# Patient Record
Sex: Female | Born: 1975 | Race: White | Hispanic: No | Marital: Married | Smoking: Never smoker
Health system: Southern US, Community
[De-identification: ages and names within clinical notes are randomized; demographics above are authoritative.]

## PROBLEM LIST (undated history)

## (undated) DIAGNOSIS — I10 Essential (primary) hypertension: Secondary | ICD-10-CM

## (undated) DIAGNOSIS — M797 Fibromyalgia: Secondary | ICD-10-CM

## (undated) DIAGNOSIS — J45909 Unspecified asthma, uncomplicated: Secondary | ICD-10-CM

## (undated) DIAGNOSIS — K760 Fatty (change of) liver, not elsewhere classified: Secondary | ICD-10-CM

## (undated) DIAGNOSIS — K838 Other specified diseases of biliary tract: Secondary | ICD-10-CM

## (undated) DIAGNOSIS — K8020 Calculus of gallbladder without cholecystitis without obstruction: Secondary | ICD-10-CM

## (undated) DIAGNOSIS — Z86718 Personal history of other venous thrombosis and embolism: Secondary | ICD-10-CM

## (undated) HISTORY — DX: Calculus of gallbladder without cholecystitis without obstruction: K80.20

## (undated) HISTORY — DX: Fatty (change of) liver, not elsewhere classified: K76.0

## (undated) HISTORY — DX: Other specified diseases of biliary tract: K83.8

## (undated) HISTORY — DX: Fibromyalgia: M79.7

## (undated) HISTORY — DX: Personal history of other venous thrombosis and embolism: Z86.718

## (undated) HISTORY — PX: COCCYGECTOMY: SUR274

## (undated) HISTORY — DX: Unspecified asthma, uncomplicated: J45.909

## (undated) HISTORY — DX: Essential (primary) hypertension: I10

---

## 2003-08-22 HISTORY — PX: SPINE SURGERY: SHX786

## 2003-09-14 ENCOUNTER — Ambulatory Visit (HOSPITAL_BASED_OUTPATIENT_CLINIC_OR_DEPARTMENT_OTHER): Admission: RE | Admit: 2003-09-14 | Discharge: 2003-09-14 | Payer: Self-pay | Admitting: Orthopedic Surgery

## 2004-01-10 ENCOUNTER — Ambulatory Visit (HOSPITAL_COMMUNITY): Admission: RE | Admit: 2004-01-10 | Discharge: 2004-01-10 | Payer: Self-pay | Admitting: Orthopedic Surgery

## 2006-10-31 ENCOUNTER — Ambulatory Visit (HOSPITAL_COMMUNITY): Admission: RE | Admit: 2006-10-31 | Discharge: 2006-10-31 | Payer: Self-pay | Admitting: Orthopedic Surgery

## 2008-08-21 HISTORY — PX: ABLATION: SHX5711

## 2010-05-05 ENCOUNTER — Ambulatory Visit: Payer: Self-pay | Admitting: Diagnostic Radiology

## 2010-05-05 ENCOUNTER — Emergency Department (HOSPITAL_BASED_OUTPATIENT_CLINIC_OR_DEPARTMENT_OTHER): Admission: EM | Admit: 2010-05-05 | Discharge: 2010-05-05 | Payer: Self-pay | Admitting: Emergency Medicine

## 2010-11-03 LAB — DIFFERENTIAL
Basophils Absolute: 0.1 10*3/uL (ref 0.0–0.1)
Basophils Relative: 1 % (ref 0–1)
Eosinophils Absolute: 0.3 10*3/uL (ref 0.0–0.7)
Lymphs Abs: 2.2 10*3/uL (ref 0.7–4.0)
Monocytes Absolute: 0.7 10*3/uL (ref 0.1–1.0)
Monocytes Relative: 10 % (ref 3–12)
Neutrophils Relative %: 54 % (ref 43–77)

## 2010-11-03 LAB — URINE CULTURE
Colony Count: 40000
Culture  Setup Time: 201109151757

## 2010-11-03 LAB — URINE MICROSCOPIC-ADD ON

## 2010-11-03 LAB — COMPREHENSIVE METABOLIC PANEL
BUN: 13 mg/dL (ref 6–23)
Chloride: 108 mEq/L (ref 96–112)
GFR calc Af Amer: >60 mL/min (ref >60)
GFR calc non Af Amer: >60 mL/min (ref >60)
Glucose, Bld: 97 mg/dL (ref 70–99)
Potassium: 4.3 mEq/L (ref 3.5–5.1)
Sodium: 143 mEq/L (ref 135–145)

## 2010-11-03 LAB — CBC
Hemoglobin: 15.1 g/dL — ABNORMAL HIGH (ref 12.0–15.0)
MCH: 31.5 pg (ref 26.0–34.0)
MCHC: 34.9 g/dL (ref 30.0–36.0)
Platelets: 240 10*3/uL (ref 150–400)

## 2010-11-03 LAB — PREGNANCY, URINE: Preg Test, Ur: NEGATIVE

## 2010-11-03 LAB — URINALYSIS, ROUTINE W REFLEX MICROSCOPIC
Hgb urine dipstick: NEGATIVE
Urobilinogen, UA: 0.2 mg/dL (ref 0.0–1.0)

## 2011-01-06 NOTE — Op Note (Signed)
[name], [name]                         ACCOUNT NO.:  [id number]   MEDICAL RECORD NO.:  [id number]                   PATIENT TYPE:  AMB   LOCATION:  DSC                                  FACILITY:  [facility]   PHYSICIAN:  Harvie Junior, M.D.                DATE OF BIRTH:  Feb 05, 1976   DATE OF PROCEDURE:  09/14/2003  DATE OF DISCHARGE:                                 OPERATIVE REPORT   PREOPERATIVE DIAGNOSIS:  Painful coccyx.   POSTOPERATIVE DIAGNOSIS:  Painful coccyx.   PROCEDURE:  Coccygectomy.   SURGEON:  Harvie Junior, M.D.   ASSISTANT:  Marshia Ly, P.A.   ANESTHESIA:  General.   INDICATIONS:  Denise Vazquez is a 35 year old female with a long history of  having coccyx pain every since childbirth.  She has been having pain in that  area ever since that time.  We tried cortisone injection.  We tried anti-  inflammatory medication, activity modification, donut sitting and all of  this failed over the course of a year.  Because of continuing problems with  pain she is ultimately brought to the operating room for coccygectomy.   DESCRIPTION OF PROCEDURE:  The patient was brought to the operating room and  after adequate anesthesia was obtained with a general anesthetic, the  patient was placed prone on the operating table and slight flexion was put  in place.  At this point, the cheeks were retracted with tape and injection  of 0.25% Marcaine with 1:100,000 was instilled into the area of incision for  hemostasis.  At this point, attention was turned towards prepping and  draping this area.  A Betadine sponge was placed into the area of the anus  and then this area was walled off from the incision. At this point,  attention was turned towards prepping and draping and a routine prep and  drape was performed.  At this point, a midline incision was made.  Subcutaneous tissue was dissected down to the level of the coccyx which was  clearly identified.  The coccyx was then  removed from proximal to distal,  care being taken to stay on bone, work all the way around the bone with  Kittners, Bovie, traction, counter traction techniques.  Once the coccyx had  been removed, the wound was filled with normal saline irrigation.  The  Valsalva maneuver was performed.  There appeared to be a very nicely  maintained intact layer between the coccyx and the rectum at this point.  At  this point, the wound was copiously irrigated and suctioned dry.  Meticulous  hemostasis was obtained with electrocautery.  A sterile compressive dressing  was applied at this point after layered closure with 0, 2-0 Vicryl and 4-0  nylon sutures.  The patient at this point was taken to the recovery room.  She was noted to be in satisfactory condition.  Estimated blood loss for the  procedure was 50 mL.                                               Harvie Junior, M.D.    Ranae Plumber  D:  09/14/2003  T:  09/14/2003  Job:  [id number]

## 2012-08-21 HISTORY — PX: OTHER SURGICAL HISTORY: SHX169

## 2015-08-19 DIAGNOSIS — G4700 Insomnia, unspecified: Secondary | ICD-10-CM | POA: Insufficient documentation

## 2015-08-19 DIAGNOSIS — K644 Residual hemorrhoidal skin tags: Secondary | ICD-10-CM | POA: Insufficient documentation

## 2015-08-19 DIAGNOSIS — E042 Nontoxic multinodular goiter: Secondary | ICD-10-CM | POA: Insufficient documentation

## 2015-08-19 DIAGNOSIS — F319 Bipolar disorder, unspecified: Secondary | ICD-10-CM | POA: Insufficient documentation

## 2015-08-19 DIAGNOSIS — K6289 Other specified diseases of anus and rectum: Secondary | ICD-10-CM | POA: Insufficient documentation

## 2015-08-19 DIAGNOSIS — M5412 Radiculopathy, cervical region: Secondary | ICD-10-CM | POA: Insufficient documentation

## 2015-08-19 DIAGNOSIS — J45909 Unspecified asthma, uncomplicated: Secondary | ICD-10-CM | POA: Insufficient documentation

## 2015-08-19 DIAGNOSIS — G43009 Migraine without aura, not intractable, without status migrainosus: Secondary | ICD-10-CM | POA: Insufficient documentation

## 2015-08-19 DIAGNOSIS — E661 Drug-induced obesity: Secondary | ICD-10-CM | POA: Insufficient documentation

## 2015-08-19 DIAGNOSIS — G2581 Restless legs syndrome: Secondary | ICD-10-CM | POA: Insufficient documentation

## 2015-08-19 DIAGNOSIS — I471 Supraventricular tachycardia, unspecified: Secondary | ICD-10-CM | POA: Insufficient documentation

## 2015-08-20 ENCOUNTER — Ambulatory Visit (HOSPITAL_BASED_OUTPATIENT_CLINIC_OR_DEPARTMENT_OTHER)
Admission: RE | Admit: 2015-08-20 | Discharge: 2015-08-20 | Disposition: A | Discharge status: Home / Self Care | Payer: Self-pay | Source: Ambulatory Visit | Attending: Family Medicine | Admitting: Family Medicine

## 2015-08-20 ENCOUNTER — Other Ambulatory Visit: Payer: Self-pay | Admitting: Family Medicine

## 2015-08-20 ENCOUNTER — Ambulatory Visit (HOSPITAL_BASED_OUTPATIENT_CLINIC_OR_DEPARTMENT_OTHER): Payer: Self-pay

## 2015-08-20 DIAGNOSIS — R52 Pain, unspecified: Secondary | ICD-10-CM

## 2015-08-20 DIAGNOSIS — I8392 Asymptomatic varicose veins of left lower extremity: Secondary | ICD-10-CM | POA: Insufficient documentation

## 2015-08-20 DIAGNOSIS — M79652 Pain in left thigh: Secondary | ICD-10-CM | POA: Insufficient documentation

## 2015-08-30 DIAGNOSIS — I83892 Varicose veins of left lower extremities with other complications: Secondary | ICD-10-CM | POA: Insufficient documentation

## 2016-05-09 DIAGNOSIS — I872 Venous insufficiency (chronic) (peripheral): Secondary | ICD-10-CM | POA: Insufficient documentation

## 2016-05-11 DIAGNOSIS — R109 Unspecified abdominal pain: Secondary | ICD-10-CM | POA: Insufficient documentation

## 2017-07-28 DIAGNOSIS — S92251A Displaced fracture of navicular [scaphoid] of right foot, initial encounter for closed fracture: Secondary | ICD-10-CM | POA: Insufficient documentation

## 2017-07-28 DIAGNOSIS — S32001A Stable burst fracture of unspecified lumbar vertebra, initial encounter for closed fracture: Secondary | ICD-10-CM | POA: Insufficient documentation

## 2017-08-01 DIAGNOSIS — S22079A Unspecified fracture of T9-T10 vertebra, initial encounter for closed fracture: Secondary | ICD-10-CM | POA: Insufficient documentation

## 2018-01-17 DIAGNOSIS — G90521 Complex regional pain syndrome I of right lower limb: Secondary | ICD-10-CM | POA: Insufficient documentation

## 2018-07-28 ENCOUNTER — Inpatient Hospital Stay (HOSPITAL_COMMUNITY)
Admission: EM | Admit: 2018-07-28 | Discharge: 2018-08-02 | DRG: 917 | Disposition: A | Discharge status: Home / Self Care | Payer: Self-pay | Attending: Internal Medicine | Admitting: Internal Medicine

## 2018-07-28 ENCOUNTER — Emergency Department (HOSPITAL_COMMUNITY): Payer: Self-pay

## 2018-07-28 DIAGNOSIS — E876 Hypokalemia: Secondary | ICD-10-CM | POA: Diagnosis not present

## 2018-07-28 DIAGNOSIS — A408 Other streptococcal sepsis: Secondary | ICD-10-CM | POA: Diagnosis present

## 2018-07-28 DIAGNOSIS — J690 Pneumonitis due to inhalation of food and vomit: Secondary | ICD-10-CM | POA: Diagnosis present

## 2018-07-28 DIAGNOSIS — F1110 Opioid abuse, uncomplicated: Secondary | ICD-10-CM | POA: Diagnosis present

## 2018-07-28 DIAGNOSIS — Z833 Family history of diabetes mellitus: Secondary | ICD-10-CM

## 2018-07-28 DIAGNOSIS — M545 Low back pain: Secondary | ICD-10-CM | POA: Diagnosis present

## 2018-07-28 DIAGNOSIS — J45901 Unspecified asthma with (acute) exacerbation: Secondary | ICD-10-CM | POA: Diagnosis present

## 2018-07-28 DIAGNOSIS — G9340 Encephalopathy, unspecified: Secondary | ICD-10-CM | POA: Diagnosis present

## 2018-07-28 DIAGNOSIS — D649 Anemia, unspecified: Secondary | ICD-10-CM | POA: Diagnosis present

## 2018-07-28 DIAGNOSIS — G8929 Other chronic pain: Secondary | ICD-10-CM | POA: Diagnosis present

## 2018-07-28 DIAGNOSIS — I10 Essential (primary) hypertension: Secondary | ICD-10-CM

## 2018-07-28 DIAGNOSIS — R4182 Altered mental status, unspecified: Secondary | ICD-10-CM

## 2018-07-28 DIAGNOSIS — G9050 Complex regional pain syndrome I, unspecified: Secondary | ICD-10-CM | POA: Diagnosis present

## 2018-07-28 DIAGNOSIS — N179 Acute kidney failure, unspecified: Secondary | ICD-10-CM | POA: Diagnosis present

## 2018-07-28 DIAGNOSIS — R000 Tachycardia, unspecified: Secondary | ICD-10-CM | POA: Diagnosis present

## 2018-07-28 DIAGNOSIS — Z888 Allergy status to other drugs, medicaments and biological substances status: Secondary | ICD-10-CM

## 2018-07-28 DIAGNOSIS — K029 Dental caries, unspecified: Secondary | ICD-10-CM | POA: Diagnosis present

## 2018-07-28 DIAGNOSIS — T424X1A Poisoning by benzodiazepines, accidental (unintentional), initial encounter: Principal | ICD-10-CM | POA: Diagnosis present

## 2018-07-28 DIAGNOSIS — M549 Dorsalgia, unspecified: Secondary | ICD-10-CM

## 2018-07-28 DIAGNOSIS — T50901A Poisoning by unspecified drugs, medicaments and biological substances, accidental (unintentional), initial encounter: Secondary | ICD-10-CM

## 2018-07-28 DIAGNOSIS — G9341 Metabolic encephalopathy: Secondary | ICD-10-CM | POA: Diagnosis present

## 2018-07-28 DIAGNOSIS — F319 Bipolar disorder, unspecified: Secondary | ICD-10-CM | POA: Diagnosis present

## 2018-07-28 LAB — ETHANOL

## 2018-07-28 LAB — CBC WITH DIFFERENTIAL/PLATELET
ABS IMMATURE GRANULOCYTES: 0.32 10*3/uL — AB (ref 0.00–0.07)
BASOS PCT: 0 %
Basophils Absolute: 0.1 10*3/uL (ref 0.0–0.1)
EOS PCT: 0 %
Eosinophils Absolute: 0 10*3/uL (ref 0.0–0.5)
HCT: 51.2 % — ABNORMAL HIGH (ref 36.0–46.0)
Hemoglobin: 15.8 g/dL — ABNORMAL HIGH (ref 12.0–15.0)
Immature Granulocytes: 2 %
Lymphocytes Relative: 3 %
Lymphs Abs: 0.6 10*3/uL — ABNORMAL LOW (ref 0.7–4.0)
MCH: 29.7 pg (ref 26.0–34.0)
MCHC: 30.9 g/dL (ref 30.0–36.0)
MCV: 96.2 fL (ref 80.0–100.0)
MONO ABS: 1.3 10*3/uL — AB (ref 0.1–1.0)
MONOS PCT: 7 %
Neutro Abs: 15.1 10*3/uL — ABNORMAL HIGH (ref 1.7–7.7)
Neutrophils Relative %: 88 %
PLATELETS: 378 10*3/uL (ref 150–400)
RBC: 5.32 MIL/uL — ABNORMAL HIGH (ref 3.87–5.11)
RDW: 12.7 % (ref 11.5–15.5)
WBC: 17.3 10*3/uL — ABNORMAL HIGH (ref 4.0–10.5)
nRBC: 0 % (ref 0.0–0.2)

## 2018-07-28 LAB — COMPREHENSIVE METABOLIC PANEL
ALT: 24 U/L (ref 0–44)
AST: 44 U/L — AB (ref 15–41)
Albumin: 4.4 g/dL (ref 3.5–5.0)
Alkaline Phosphatase: 80 U/L (ref 38–126)
Anion gap: 17 — ABNORMAL HIGH (ref 5–15)
BILIRUBIN TOTAL: 0.4 mg/dL (ref 0.3–1.2)
BUN: 19 mg/dL (ref 6–20)
CALCIUM: 8.9 mg/dL (ref 8.9–10.3)
CO2: 23 mmol/L (ref 22–32)
CREATININE: 2.3 mg/dL — AB (ref 0.44–1.00)
Chloride: 104 mmol/L (ref 98–111)
GFR calc Af Amer: 30 mL/min — ABNORMAL LOW (ref >60)
GFR, EST NON AFRICAN AMERICAN: 26 mL/min — AB (ref >60)
Glucose, Bld: 80 mg/dL (ref 70–99)
Potassium: 3.7 mmol/L (ref 3.5–5.1)
Sodium: 144 mmol/L (ref 135–145)
Total Protein: 7.4 g/dL (ref 6.5–8.1)

## 2018-07-28 LAB — I-STAT BETA HCG BLOOD, ED (MC, WL, AP ONLY): I-stat hCG, quantitative: <5 m[IU]/mL (ref <5)

## 2018-07-28 MED ORDER — SODIUM CHLORIDE 0.9 % IV BOLUS
1000.0000 mL | Freq: Once | INTRAVENOUS | Status: AC
  Administered 2018-07-29: 1000 mL via INTRAVENOUS

## 2018-07-28 MED ORDER — SODIUM CHLORIDE 0.9 % IV BOLUS
1000.0000 mL | Freq: Once | INTRAVENOUS | Status: AC
  Administered 2018-07-28: 1000 mL via INTRAVENOUS

## 2018-07-28 NOTE — ED Notes (Signed)
Patient notified that husband is on his way.

## 2018-07-28 NOTE — ED Notes (Signed)
Patient denying any drug or alcohol use at this time.
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
## 2018-07-28 NOTE — ED Notes (Signed)
X-ray at bedside

## 2018-07-28 NOTE — ED Notes (Signed)
Patient was found to have one episode of emesis, it was thin and brown colored, moderate amount. Patient vomited on herself, gown changed, given fresh linen. Patient denies any N/V at this time.

## 2018-07-28 NOTE — ED Notes (Signed)
Pt ambulated to bathroom with assist.

## 2018-07-28 NOTE — ED Notes (Signed)
Patient transported to CT 

## 2018-07-28 NOTE — ED Notes (Signed)
Patient ambulated to RR with assistance. Patient was given a hat to cath urine, aware that we needed urine sample. Patient's urine missed the hat, no urine sample was collected. Patient still aware that we need a urine sample.
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
## 2018-07-29 ENCOUNTER — Encounter (HOSPITAL_COMMUNITY): Payer: Self-pay | Admitting: Internal Medicine

## 2018-07-29 ENCOUNTER — Observation Stay (HOSPITAL_COMMUNITY): Payer: Self-pay

## 2018-07-29 ENCOUNTER — Observation Stay (HOSPITAL_COMMUNITY)
Admit: 2018-07-29 | Discharge: 2018-07-29 | Disposition: A | Discharge status: Home / Self Care | Payer: Self-pay | Attending: Internal Medicine | Admitting: Internal Medicine

## 2018-07-29 ENCOUNTER — Other Ambulatory Visit: Payer: Self-pay

## 2018-07-29 DIAGNOSIS — N179 Acute kidney failure, unspecified: Secondary | ICD-10-CM | POA: Insufficient documentation

## 2018-07-29 DIAGNOSIS — T50901A Poisoning by unspecified drugs, medicaments and biological substances, accidental (unintentional), initial encounter: Secondary | ICD-10-CM

## 2018-07-29 DIAGNOSIS — G9340 Encephalopathy, unspecified: Secondary | ICD-10-CM | POA: Diagnosis present

## 2018-07-29 DIAGNOSIS — G8929 Other chronic pain: Secondary | ICD-10-CM | POA: Diagnosis present

## 2018-07-29 DIAGNOSIS — I361 Nonrheumatic tricuspid (valve) insufficiency: Secondary | ICD-10-CM

## 2018-07-29 DIAGNOSIS — T424X2A Poisoning by benzodiazepines, intentional self-harm, initial encounter: Secondary | ICD-10-CM

## 2018-07-29 DIAGNOSIS — T1491XA Suicide attempt, initial encounter: Secondary | ICD-10-CM

## 2018-07-29 DIAGNOSIS — M549 Dorsalgia, unspecified: Secondary | ICD-10-CM

## 2018-07-29 DIAGNOSIS — R000 Tachycardia, unspecified: Secondary | ICD-10-CM | POA: Diagnosis present

## 2018-07-29 LAB — HEPATIC FUNCTION PANEL
ALBUMIN: 3.1 g/dL — AB (ref 3.5–5.0)
ALT: 22 U/L (ref 0–44)
AST: 43 U/L — AB (ref 15–41)
Alkaline Phosphatase: 35 U/L — ABNORMAL LOW (ref 38–126)
BILIRUBIN DIRECT: 0.1 mg/dL (ref 0.0–0.2)
BILIRUBIN TOTAL: 0.4 mg/dL (ref 0.3–1.2)
Indirect Bilirubin: 0.3 mg/dL (ref 0.3–0.9)
Total Protein: 5.3 g/dL — ABNORMAL LOW (ref 6.5–8.1)

## 2018-07-29 LAB — URINALYSIS, ROUTINE W REFLEX MICROSCOPIC
Bilirubin Urine: NEGATIVE
GLUCOSE, UA: NEGATIVE mg/dL
KETONES UR: NEGATIVE mg/dL
LEUKOCYTES UA: NEGATIVE
NITRITE: NEGATIVE
PROTEIN: NEGATIVE mg/dL
Specific Gravity, Urine: 1.014 (ref 1.005–1.030)
pH: 5 (ref 5.0–8.0)

## 2018-07-29 LAB — MRSA PCR SCREENING: MRSA by PCR: NEGATIVE

## 2018-07-29 LAB — TROPONIN I
TROPONIN I: 0.4 ng/mL — AB (ref <0.03)
Troponin I: 0.88 ng/mL (ref <0.03)
Troponin I: 0.75 ng/mL (ref <0.03)
Troponin I: 0.54 ng/mL (ref <0.03)

## 2018-07-29 LAB — LACTIC ACID, PLASMA
Lactic Acid, Venous: 2.3 mmol/L (ref 0.5–1.9)
Lactic Acid, Venous: 5.3 mmol/L (ref 0.5–1.9)
Lactic Acid, Venous: 3 mmol/L (ref 0.5–1.9)
Lactic Acid, Venous: 1.5 mmol/L (ref 0.5–1.9)

## 2018-07-29 LAB — MAGNESIUM: Magnesium: 1.6 mg/dL — ABNORMAL LOW (ref 1.7–2.4)

## 2018-07-29 LAB — EXPECTORATED SPUTUM ASSESSMENT W REFEX TO RESP CULTURE

## 2018-07-29 LAB — CBC
HCT: 35.6 % — ABNORMAL LOW (ref 36.0–46.0)
Hemoglobin: 11.6 g/dL — ABNORMAL LOW (ref 12.0–15.0)
MCH: 30.9 pg (ref 26.0–34.0)
MCHC: 32.6 g/dL (ref 30.0–36.0)
MCV: 94.9 fL (ref 80.0–100.0)
PLATELETS: 208 10*3/uL (ref 150–400)
RBC: 3.75 MIL/uL — ABNORMAL LOW (ref 3.87–5.11)
RDW: 12.8 % (ref 11.5–15.5)
WBC: 16.9 10*3/uL — AB (ref 4.0–10.5)
nRBC: 0 % (ref 0.0–0.2)

## 2018-07-29 LAB — RAPID URINE DRUG SCREEN, HOSP PERFORMED
AMPHETAMINES: NOT DETECTED
Barbiturates: NOT DETECTED
Benzodiazepines: POSITIVE — AB
COCAINE: NOT DETECTED
OPIATES: NOT DETECTED
TETRAHYDROCANNABINOL: NOT DETECTED

## 2018-07-29 LAB — EXPECTORATED SPUTUM ASSESSMENT W GRAM STAIN, RFLX TO RESP C

## 2018-07-29 LAB — BASIC METABOLIC PANEL
Anion gap: 9 (ref 5–15)
BUN: 21 mg/dL — ABNORMAL HIGH (ref 6–20)
CALCIUM: 7.3 mg/dL — AB (ref 8.9–10.3)
CO2: 20 mmol/L — ABNORMAL LOW (ref 22–32)
Chloride: 112 mmol/L — ABNORMAL HIGH (ref 98–111)
Creatinine, Ser: 1.25 mg/dL — ABNORMAL HIGH (ref 0.44–1.00)
GFR calc Af Amer: >60 mL/min (ref >60)
GFR calc non Af Amer: 54 mL/min — ABNORMAL LOW (ref >60)
Glucose, Bld: 138 mg/dL — ABNORMAL HIGH (ref 70–99)
Potassium: 3.8 mmol/L (ref 3.5–5.1)
Sodium: 141 mmol/L (ref 135–145)

## 2018-07-29 LAB — T4, FREE: Free T4: 0.81 ng/dL — ABNORMAL LOW (ref 0.82–1.77)

## 2018-07-29 LAB — HIV ANTIBODY (ROUTINE TESTING W REFLEX): HIV Screen 4th Generation wRfx: NONREACTIVE

## 2018-07-29 LAB — ECHOCARDIOGRAM COMPLETE
Height: 65 in
Weight: 2504.43 oz

## 2018-07-29 LAB — PROTIME-INR
INR: 1.15
Prothrombin Time: 14.6 seconds (ref 11.4–15.2)

## 2018-07-29 LAB — D-DIMER, QUANTITATIVE: D-Dimer, Quant: 4.54 ug/mL-FEU — ABNORMAL HIGH (ref 0.00–0.50)

## 2018-07-29 LAB — APTT: aPTT: 26 seconds (ref 24–36)

## 2018-07-29 LAB — STREP PNEUMONIAE URINARY ANTIGEN: Strep Pneumo Urinary Antigen: NEGATIVE

## 2018-07-29 LAB — CK: CK TOTAL: 60 U/L (ref 38–234)

## 2018-07-29 LAB — TSH: TSH: 0.247 u[IU]/mL — ABNORMAL LOW (ref 0.350–4.500)

## 2018-07-29 MED ORDER — SODIUM CHLORIDE 0.9 % IV SOLN
INTRAVENOUS | Status: DC | PRN
  Administered 2018-07-29: 250 mL via INTRAVENOUS

## 2018-07-29 MED ORDER — MENTHOL 3 MG MT LOZG
1.0000 | LOZENGE | OROMUCOSAL | Status: DC | PRN
  Administered 2018-07-29: 3 mg via ORAL
  Filled 2018-07-29: qty 9

## 2018-07-29 MED ORDER — ACETAMINOPHEN 325 MG PO TABS
650.0000 mg | ORAL_TABLET | Freq: Four times a day (QID) | ORAL | Status: DC | PRN
  Administered 2018-07-29 – 2018-08-02 (×4): 650 mg via ORAL
  Filled 2018-07-29 (×4): qty 2

## 2018-07-29 MED ORDER — CARISOPRODOL 350 MG PO TABS
350.0000 mg | ORAL_TABLET | Freq: Every day | ORAL | Status: DC | PRN
  Administered 2018-07-30: 350 mg via ORAL
  Filled 2018-07-29: qty 1

## 2018-07-29 MED ORDER — SODIUM CHLORIDE 0.9 % IV BOLUS
2000.0000 mL | Freq: Once | INTRAVENOUS | Status: AC
  Administered 2018-07-29: 2000 mL via INTRAVENOUS

## 2018-07-29 MED ORDER — CARISOPRODOL 350 MG PO TABS
350.0000 mg | ORAL_TABLET | Freq: Two times a day (BID) | ORAL | Status: DC | PRN
  Administered 2018-07-29: 350 mg via ORAL
  Filled 2018-07-29: qty 1

## 2018-07-29 MED ORDER — KETOROLAC TROMETHAMINE 15 MG/ML IJ SOLN
15.0000 mg | Freq: Once | INTRAMUSCULAR | Status: AC
  Administered 2018-07-29: 15 mg via INTRAVENOUS
  Filled 2018-07-29: qty 1

## 2018-07-29 MED ORDER — CARISOPRODOL 350 MG PO TABS: 350.0000 mg | ORAL_TABLET | Freq: Two times a day (BID) | ORAL | Status: DC | PRN

## 2018-07-29 MED ORDER — ORAL CARE MOUTH RINSE
15.0000 mL | Freq: Two times a day (BID) | OROMUCOSAL | Status: DC
  Administered 2018-07-30 – 2018-08-02 (×3): 15 mL via OROMUCOSAL

## 2018-07-29 MED ORDER — ACETAMINOPHEN 650 MG RE SUPP: 650.0000 mg | Freq: Four times a day (QID) | RECTAL | Status: DC | PRN

## 2018-07-29 MED ORDER — HEPARIN BOLUS VIA INFUSION
4000.0000 [IU] | Freq: Once | INTRAVENOUS | Status: AC
  Administered 2018-07-29: 4000 [IU] via INTRAVENOUS
  Filled 2018-07-29: qty 4000

## 2018-07-29 MED ORDER — LEVALBUTEROL HCL 0.63 MG/3ML IN NEBU
0.6300 mg | INHALATION_SOLUTION | Freq: Four times a day (QID) | RESPIRATORY_TRACT | Status: DC
  Administered 2018-07-29 – 2018-07-30 (×4): 0.63 mg via RESPIRATORY_TRACT
  Filled 2018-07-29 (×7): qty 3

## 2018-07-29 MED ORDER — ONDANSETRON HCL 4 MG/2ML IJ SOLN
4.0000 mg | Freq: Four times a day (QID) | INTRAMUSCULAR | Status: DC | PRN
  Administered 2018-07-29 – 2018-07-30 (×2): 4 mg via INTRAVENOUS
  Filled 2018-07-29 (×3): qty 2

## 2018-07-29 MED ORDER — MAGNESIUM SULFATE 2 GM/50ML IV SOLN
2.0000 g | Freq: Once | INTRAVENOUS | Status: AC
  Administered 2018-07-29: 2 g via INTRAVENOUS
  Filled 2018-07-29: qty 50

## 2018-07-29 MED ORDER — TECHNETIUM TO 99M ALBUMIN AGGREGATED
3.8000 | Freq: Once | INTRAVENOUS | Status: AC | PRN
  Administered 2018-07-29: 3.8 via INTRAVENOUS

## 2018-07-29 MED ORDER — HYDRALAZINE HCL 20 MG/ML IJ SOLN
10.0000 mg | Freq: Four times a day (QID) | INTRAMUSCULAR | Status: DC | PRN
  Administered 2018-07-29 – 2018-07-31 (×3): 10 mg via INTRAVENOUS
  Filled 2018-07-29 (×3): qty 1

## 2018-07-29 MED ORDER — TRAMADOL HCL 50 MG PO TABS
50.0000 mg | ORAL_TABLET | Freq: Three times a day (TID) | ORAL | Status: DC | PRN
  Administered 2018-07-29 – 2018-07-30 (×2): 50 mg via ORAL
  Filled 2018-07-29 (×2): qty 1

## 2018-07-29 MED ORDER — DIPHENHYDRAMINE HCL 50 MG/ML IJ SOLN: 25.0000 mg | Freq: Four times a day (QID) | INTRAMUSCULAR | Status: DC | PRN

## 2018-07-29 MED ORDER — SODIUM CHLORIDE 0.9 % IV SOLN
1.0000 g | INTRAVENOUS | Status: DC
  Administered 2018-07-29 – 2018-07-30 (×2): 1 g via INTRAVENOUS
  Filled 2018-07-29: qty 10
  Filled 2018-07-29: qty 1

## 2018-07-29 MED ORDER — SODIUM CHLORIDE 0.9 % IV SOLN
INTRAVENOUS | Status: DC
  Administered 2018-07-29 (×4): via INTRAVENOUS

## 2018-07-29 MED ORDER — SODIUM CHLORIDE 0.9 % IV SOLN
500.0000 mg | INTRAVENOUS | Status: DC
  Administered 2018-07-29 – 2018-07-30 (×2): 500 mg via INTRAVENOUS
  Filled 2018-07-29 (×2): qty 500

## 2018-07-29 MED ORDER — FAMOTIDINE 20 MG PO TABS
20.0000 mg | ORAL_TABLET | Freq: Two times a day (BID) | ORAL | Status: DC
  Administered 2018-07-29 – 2018-08-02 (×9): 20 mg via ORAL
  Filled 2018-07-29 (×9): qty 1

## 2018-07-29 MED ORDER — ZOLPIDEM TARTRATE 5 MG PO TABS
5.0000 mg | ORAL_TABLET | Freq: Every evening | ORAL | Status: DC | PRN
  Administered 2018-07-29 – 2018-08-01 (×4): 5 mg via ORAL
  Filled 2018-07-29 (×4): qty 1

## 2018-07-29 MED ORDER — ONDANSETRON HCL 4 MG PO TABS
4.0000 mg | ORAL_TABLET | Freq: Four times a day (QID) | ORAL | Status: DC | PRN
  Administered 2018-07-29: 4 mg via ORAL
  Filled 2018-07-29: qty 1

## 2018-07-29 MED ORDER — TECHNETIUM TC 99M DIETHYLENETRIAME-PENTAACETIC ACID
25.0000 | Freq: Once | INTRAVENOUS | Status: AC | PRN
  Administered 2018-07-29: 25 via INTRAVENOUS

## 2018-07-29 MED ORDER — HEPARIN (PORCINE) 25000 UT/250ML-% IV SOLN
1200.0000 [IU]/h | INTRAVENOUS | Status: DC
  Administered 2018-07-29: 1200 [IU]/h via INTRAVENOUS
  Filled 2018-07-29: qty 250

## 2018-07-29 MED ORDER — LORATADINE 10 MG PO TABS
10.0000 mg | ORAL_TABLET | Freq: Every day | ORAL | Status: DC
  Administered 2018-07-29 – 2018-08-02 (×5): 10 mg via ORAL
  Filled 2018-07-29 (×5): qty 1

## 2018-07-29 MED ORDER — SODIUM CHLORIDE 0.9 % IV BOLUS
500.0000 mL | Freq: Once | INTRAVENOUS | Status: AC
  Administered 2018-07-29: 500 mL via INTRAVENOUS

## 2018-07-29 MED ORDER — METHYLPREDNISOLONE SODIUM SUCC 40 MG IJ SOLR
40.0000 mg | Freq: Two times a day (BID) | INTRAMUSCULAR | Status: DC
  Administered 2018-07-29 – 2018-07-30 (×2): 40 mg via INTRAVENOUS
  Filled 2018-07-29 (×2): qty 1

## 2018-07-29 MED ORDER — METOPROLOL TARTRATE 5 MG/5ML IV SOLN: 2.5000 mg | Freq: Four times a day (QID) | INTRAVENOUS | Status: DC | PRN

## 2018-07-29 MED ORDER — ALBUTEROL SULFATE (2.5 MG/3ML) 0.083% IN NEBU
2.5000 mg | INHALATION_SOLUTION | RESPIRATORY_TRACT | Status: DC | PRN
  Administered 2018-07-29: 2.5 mg via RESPIRATORY_TRACT

## 2018-07-29 NOTE — ED Notes (Signed)
[facility].

## 2018-07-29 NOTE — ED Notes (Signed)
Pt ambulated to bathroom with minimal assist 

## 2018-07-29 NOTE — ED Notes (Signed)
Husband called back after he left and [facility], they told him [facility], he was going there to see if his car was there and if they had any video of her being there.
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
## 2018-07-29 NOTE — Procedures (Signed)
ELECTROENCEPHALOGRAM REPORT   Patient: Denise GreavesMarjorie Catapano       Room #: 1236 EEG No. ID: [id number]- Age: 42 y.o.        Sex: female Referring Physician: Regalado Report Date:  07/29/2018        Interpreting Physician: Thana FarrEYNOLDS, Amiayah Giebel  History: Denise GreavesMarjorie Cuevas is an 42 y.o. female found unresponsive  Medications:  Zithromax, Rocephin, Pepcid, Claritin, Heparin  Conditions of Recording:  This is a 21 channel routine scalp EEG performed with bipolar and monopolar montages arranged in accordance to the international 10/20 system of electrode placement. One channel was dedicated to EKG recording.  The patient is in the awake and drowsy states.  Description:  The waking background activity consists of a low voltage, symmetrical, fairly well organized, 8-9 Hz alpha activity, seen from the parieto-occipital and posterior temporal regions.  Low voltage fast activity, poorly organized, is seen anteriorly and is at times superimposed on more posterior regions.  A mixture of theta and alpha rhythms are seen from the central and temporal regions. The patient has periods of slowing to irregular, low voltage theta and beta activity that appear to be drowse.  .   Stage II sleep is not obtained. Hyperventilation and intermittent photic stimulation were not performed.   IMPRESSION: Normal electroencephalogram, awake and drowsy. There are no focal lateralizing or epileptiform features.   Thana FarrLeslie Tashaya Ancrum, MD Neurology [phone] 07/29/2018, 1:11 PM

## 2018-07-29 NOTE — Progress Notes (Signed)
EEG completed, results pending. 

## 2018-07-29 NOTE — H&P (Signed)
History and Physical    Denise Vazquez ZOX:[id number] DOB: 1976-07-05 DOA: 07/28/2018  PCP: Loyal Jacobson, MD  Patient coming from: Home.  Chief Complaint: Loss of consciousness.  HPI: Denise Vazquez is a 42 y.o. female with history of chronic back pain on soma was brought to the ER after patient was found to be unresponsive in a hotel as per the ER physician.  Patient was given Narcan following which patient had minimal response.  By the time patient reached ER became more alert awake.  But after reaching the ER patient was stating that she does not recall any incident what happened prior to that.  Has some fullness of the both ears with difficulty hearing.  Is able to move all extremities without any difficulty.  Complains of some chest tightness and palpitation.  ED Course: In the ER labs reveal WBC count of 17.3 creatinine of 2.3 anion gap of 17 lactate of 2.3 CK of 60 alcohol level was negative pregnancy was negative, CT head was negative.  Chest x-ray was showing possible developing infiltrate.  EKG shows sinus tachycardia with probable biatrial enlargement and left ventricular hypertrophy.  Review of Systems: As per HPI, rest all negative.   History reviewed. No pertinent past medical history.  Past Surgical History:  Procedure Laterality Date  . COCCYGECTOMY       reports that she has never smoked. She has never used smokeless tobacco. She reports that she does not drink alcohol. Her drug history is not on file.  Allergies  Allergen Reactions  . Tizanidine Nausea Only    Family History  Problem Relation Age of Onset  . Diabetes Mellitus II Father     Prior to Admission medications   Medication Sig Start Date End Date Taking? Authorizing Provider  albuterol (PROVENTIL HFA;VENTOLIN HFA) 108 (90 Base) MCG/ACT inhaler Inhale 2 puffs into the lungs every 4 (four) hours as needed for wheezing or shortness of breath.  06/05/18  Yes [provider]  carisoprodol  (SOMA) 350 MG tablet Take 350 mg by mouth 2 (two) times daily as needed for muscle spasms.  07/20/18  Yes [provider]  zolpidem (AMBIEN) 10 MG tablet Take 10 mg by mouth at bedtime as needed for sleep.  07/20/18  Yes [provider]    Physical Exam: Vitals:   07/28/18 2245 07/28/18 2315 07/28/18 2352 07/29/18 0030  BP: 101/64 94/69 (!) 83/57 96/68  Pulse: (!) 129 (!) 131 (!) 120 (!) 118  Resp: 20 (!) 21 13 16   Temp:      SpO2: 95% 94% 93% 98%      Constitutional: Moderately built and nourished. Vitals:   07/28/18 2245 07/28/18 2315 07/28/18 2352 07/29/18 0030  BP: 101/64 94/69 (!) 83/57 96/68  Pulse: (!) 129 (!) 131 (!) 120 (!) 118  Resp: 20 (!) 21 13 16   Temp:      SpO2: 95% 94% 93% 98%   Eyes: Anicteric no pallor. ENMT: No discharge from the ears eyes nose or mouth. Neck: No mass felt.  No neck rigidity.  No JVD appreciated. Respiratory: No rhonchi or crepitations. Cardiovascular: S1-S2 heard tachycardic. Abdomen: Soft nontender bowel sounds present. Musculoskeletal: No edema.  No joint effusion. Skin: No rash. Neurologic: Alert awake oriented to her name and place but does not recall what happened recently.  Moves all extremities.  Pupils equal and reacting to light. Psychiatric: Appears normal normal affect.   Labs on Admission: I have personally reviewed following labs and imaging studies  CBC: Recent Labs  Lab 07/28/18 2053  WBC 17.3*  NEUTROABS 15.1*  HGB 15.8*  HCT 51.2*  MCV 96.2  PLT 378   Basic Metabolic Panel: Recent Labs  Lab 07/28/18 2053  NA 144  K 3.7  CL 104  CO2 23  GLUCOSE 80  BUN 19  CREATININE 2.30*  CALCIUM 8.9   GFR: CrCl cannot be calculated (Unknown ideal weight.). Liver Function Tests: Recent Labs  Lab 07/28/18 2053  AST 44*  ALT 24  ALKPHOS 80  BILITOT 0.4  PROT 7.4  ALBUMIN 4.4   No results for input(s): LIPASE, AMYLASE in the last 168 hours. No results for input(s): AMMONIA in the last 168  hours. Coagulation Profile: No results for input(s): INR, PROTIME in the last 168 hours. Cardiac Enzymes: Recent Labs  Lab 07/28/18 2053  CKTOTAL 60   BNP (last 3 results) No results for input(s): PROBNP in the last 8760 hours. HbA1C: No results for input(s): HGBA1C in the last 72 hours. CBG: No results for input(s): GLUCAP in the last 168 hours. Lipid Profile: No results for input(s): CHOL, HDL, LDLCALC, TRIG, CHOLHDL, LDLDIRECT in the last 72 hours. Thyroid Function Tests: No results for input(s): TSH, T4TOTAL, FREET4, T3FREE, THYROIDAB in the last 72 hours. Anemia Panel: No results for input(s): VITAMINB12, FOLATE, FERRITIN, TIBC, IRON, RETICCTPCT in the last 72 hours. Urine analysis:    Component Value Date/Time   COLORURINE YELLOW 05/05/2010 0942   APPEARANCEUR CLOUDY (A) 05/05/2010 0942   LABSPEC 1.024 05/05/2010 0942   PHURINE 5.5 05/05/2010 0942   GLUCOSEU NEGATIVE 05/05/2010 0942   HGBUR NEGATIVE 05/05/2010 0942   BILIRUBINUR NEGATIVE 05/05/2010 0942   KETONESUR NEGATIVE 05/05/2010 0942   PROTEINUR NEGATIVE 05/05/2010 0942   UROBILINOGEN 0.2 05/05/2010 0942   NITRITE NEGATIVE 05/05/2010 0942   LEUKOCYTESUR MODERATE (A) 05/05/2010 0942   Sepsis Labs: @LABRCNTIP (procalcitonin:4,lacticidven:4) )No results found for this or any previous visit (from the past 240 hour(s)).   Radiological Exams on Admission: Ct Head Wo Contrast  Result Date: 07/28/2018 CLINICAL DATA:  42 year old female with altered mental status. EXAM: CT HEAD WITHOUT CONTRAST TECHNIQUE: Contiguous axial images were obtained from the base of the skull through the vertex without intravenous contrast. COMPARISON:  None. FINDINGS: Brain: The ventricles and sulci appropriate size for patient's age. The gray-white matter discrimination is preserved. There is no acute intracranial hemorrhage. No mass effect or midline shift. No extra-axial fluid collection. Vascular: No hyperdense vessel or unexpected  calcification. Skull: Normal. Negative for fracture or focal lesion. Sinuses/Orbits: Bilateral maxillary sinus retention cyst or polyp. The mastoid air cells are clear. Other: None IMPRESSION: No acute intracranial pathology. Electronically Signed   By: Elgie Collard M.D.   On: 07/28/2018 23:42   Dg Chest Portable 1 View  Result Date: 07/28/2018 CLINICAL DATA:  42 year old female with hypoxia. EXAM: PORTABLE CHEST 1 VIEW COMPARISON:  None. FINDINGS: Minimal increased density is over the left lower lung field, likely atelectatic changes. Developing infiltrate is less likely but not excluded. Clinical correlation is recommended. No focal consolidation, pleural effusion, or pneumothorax. The cardiac silhouette is within normal limits. No acute osseous pathology. IMPRESSION: Minimal left lung base atelectasis, less likely developing infiltrate. Electronically Signed   By: Elgie Collard M.D.   On: 07/28/2018 21:31    EKG: Independently reviewed.  Sinus tachycardia.  Assessment/Plan Principal Problem:   Acute encephalopathy Active Problems:   Tachycardia   Chronic back pain   ARF (acute renal failure) (HCC)    1.  Acute encephalopathy/acute delirium -patient does not recall what happened recently.  Not sure if patient had any drugs or any seizures.  Urine drug screen is pending.  If patient continues to be confused and complains of fullness in the ER will get MRI of the brain.  Continue with hydration. 2. Possible pneumonia with possible sepsis for which we will get blood cultures and I have placed patient on ceftriaxone and Zithromax follow sputum cultures urine for Legionella and strep antigen. 3. Sinus tachycardia with chest pain-follow urine drug screen.  Check d-dimer, lactate cardiac markers thyroid function tests.  Also check 2D echo. 4. Acute renal failure -continue hydration.  Cause not clear could be from hypotension.  Follow metabolic panel. 5. Chronic back pain usually takes [place]  -discussed with pharmacy at this time pharmacy advised to hold Soma due to acute renal failure.  If creatinine improves may restart. 6. Leukocytosis -follow blood cultures.  Follow CBC.   DVT prophylaxis: SCDs for now. Code Status: Full code. Family Communication: Discussed with patient. Disposition Plan: To be determined. Consults called: None. Admission status: Inpatient.   Eduard ClosArshad N Lunetta Marina MD [facility] Pager [phone].  If 7PM-7AM, please contact night-coverage [email or web address] Password [email or web address]  07/29/2018, 12:58 AM

## 2018-07-29 NOTE — Progress Notes (Signed)
CRITICAL VALUE ALERT  Critical Value:  Lactic acid 3.0  Date & Time Notied:  9:35 AM 07/29/2018  Provider Notified: Yes  Orders Received/Actions taken: Trending lactic acid.

## 2018-07-29 NOTE — ED Notes (Signed)
Please call Kipp BroodBrent, husband, [phone].
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
## 2018-07-29 NOTE — Progress Notes (Signed)
  Echocardiogram 2D Echocardiogram has been performed.  Denise ChimesWendy  Mckyla Vazquez 07/29/2018, 2:53 PM

## 2018-07-29 NOTE — Progress Notes (Signed)
ANTICOAGULATION CONSULT NOTE - Initial Consult  Pharmacy Consult for Heparin Indication: pulmonary embolus  Allergies  Allergen Reactions  . Tizanidine Nausea Only    Patient Measurements: Height: 5\' 5"  (165.1 cm) Weight: 156 lb 8.4 oz (71 kg) IBW/kg (Calculated) : 57 Heparin Dosing Weight: 71 kg  Vital Signs: Temp: 97.9 F (36.6 C) (12/09 0800) Temp Source: Oral (12/09 0800) BP: 106/73 (12/09 0800) Pulse Rate: 117 (12/09 0800)  Labs: Recent Labs    07/28/18 2053 07/29/18 0103 07/29/18 0659  HGB 15.8*  --  11.6*  HCT 51.2*  --  35.6*  PLT 378  --  208  CREATININE 2.30*  --  1.25*  CKTOTAL 60  --   --   TROPONINI  --  0.88* 0.75*    Estimated Creatinine Clearance: 59.1 mL/min (A) (by C-G formula based on SCr of 1.25 mg/dL (H)).   Medical History: History reviewed. No pertinent past medical history.  Medications:  No anticoagulants PTA or in-house  Assessment: 42 y/o F with no known significant past medical history found unresponsive in hotel, encephalopathic on admission. D-dimer elevated, VQ scan pending.   Goal of Therapy:  Heparin level 0.3-0.7 units/ml Monitor platelets by anticoagulation protocol: Yes   Plan:  Give 4000 units bolus x 1 Start heparin infusion at 1200 units/hr Check anti-Xa level in 6 hours and daily while on heparin Continue to monitor H&H and platelets  Luisa HartChristy, Qusay Villada D 07/29/2018,9:24 AM

## 2018-07-29 NOTE — Care Management Note (Signed)
Case Management Note  Patient Details  Name: Jeronimo GreavesMarjorie Tinoco MRN: [id number] Date of Birth: 09/07/1975  Subjective/Objective:                  42 y.o. female with history of chronic back pain on soma was brought to the ER after patient was found to be unresponsive in a hotel as per the ER physician.  Patient was given Narcan following which patient had minimal response.  By the time patient reached ER became more alert awake.  But after reaching the ER patient was stating that she does not recall any incident what happened prior to that.  Has some fullness of the both ears with difficulty hearing.  Is able to move all extremities without any difficulty.  Complains of some chest tightness and palpitation.  ED Course: In the ER labs reveal WBC count of 17.3 creatinine of 2.3 anion gap of 17 lactate of 2.3 CK of 60 alcohol level was negative pregnancy was negative, CT head was negative.  Chest x-ray was showing possible developing infiltrate.  EKG shows sinus tachycardia with probable biatrial enlargement and left ventricular hypertrophy.   Action/Plan: Will follow for progression of care and clinical status. Will follow for case management needs none present at this time.  Expected Discharge Date:                  Expected Discharge Plan:  Home/Self Care  In-House Referral:     Discharge planning Services  CM Consult  Post Acute Care Choice:    Choice offered to:  Patient  DME Arranged:    DME Agency:     HH Arranged:    HH Agency:     Status of Service:  In process, will continue to follow  If discussed at Long Length of Stay Meetings, dates discussed:    Additional Comments:  Golda AcreDavis, Rhonda Lynn, RN 07/29/2018, 9:53 AM

## 2018-07-29 NOTE — ED Notes (Signed)
ED TO INPATIENT HANDOFF REPORT  Name/Age/Gender Denise Vazquez 42 y.o. female  Code Status    Code Status Orders  (From admission, onward)         Start     Ordered   07/29/18 0056  Full code  Continuous     07/29/18 0057        Code Status History    This patient has a current code status but no historical code status.      Home/SNF/Other Home  Chief Complaint drug overdose  Level of Care/Admitting Diagnosis ED Disposition    ED Disposition Condition Comment   Admit  Hospital Area: [facility]OS[facility]ITAL [[id number]]  Level of Care: Stepdown [14]  Admit to SDU based on following criteria: Severe physiological/psychological symptoms:  Any diagnosis requiring assessment & intervention at least every 4 hours on an ongoing basis to obtain desired patient outcomes including stability and rehabilitation  Diagnosis: Acute encephalopathy [130865]  Admitting Physician: Eduard Clos [phone]  Attending Physician: Eduard Clos [[id number]  PT Class (Do Not Modify): Observation [104]  PT Acc Code (Do Not Modify): Observation [10022]       Medical History History reviewed. No pertinent past medical history.  Allergies Allergies  Allergen Reactions  . Tizanidine Nausea Only    IV Location/Drains/Wounds Patient Lines/Drains/Airways Status   Active Line/Drains/Airways    Name:   Placement date:   Placement time:   Site:   Days:   Peripheral IV 07/28/18 Right Antecubital   07/28/18    2044    Antecubital   1          Labs/Imaging Results for orders placed or performed during the hospital encounter of 07/28/18 (from the past 48 hour(s))  Comprehensive metabolic panel     Status: Abnormal   Collection Time: 07/28/18  8:53 PM  Result Value Ref Range   Sodium 144 135 - 145 mmol/L   Potassium 3.7 3.5 - 5.1 mmol/L   Chloride 104 98 - 111 mmol/L   CO2 23 22 - 32 mmol/L   Glucose, Bld 80 70 - 99 mg/dL   BUN 19 6 - 20 mg/dL   Creatinine, Ser 9.62 (H)  0.44 - 1.00 mg/dL   Calcium 8.9 8.9 - 95.2 mg/dL   Total Protein 7.4 6.5 - 8.1 g/dL   Albumin 4.4 3.5 - 5.0 g/dL   AST 44 (H) 15 - 41 U/L   ALT 24 0 - 44 U/L   Alkaline Phosphatase 80 38 - 126 U/L   Total Bilirubin 0.4 0.3 - 1.2 mg/dL   GFR calc non Af Amer 26 (L) >60 mL/min   GFR calc Af Amer 30 (L) >60 mL/min   Anion gap 17 (H) 5 - 15    Comment: Performed at [facility], [address] [place], [place] [address]  Ethanol     Status: None   Collection Time: 07/28/18  8:53 PM  Result Value Ref Range   Alcohol, Ethyl (B) <10 <10 mg/dL    Comment: (NOTE) Lowest detectable limit for serum alcohol is 10 mg/dL. For medical purposes only. Performed at [facility], [address], [place], [place] [address]   CBC with Differential     Status: Abnormal   Collection Time: 07/28/18  8:53 PM  Result Value Ref Range   WBC 17.3 (H) 4.0 - 10.5 K/uL   RBC 5.32 (H) 3.87 - 5.11 MIL/uL   Hemoglobin 15.8 (H) 12.0 - 15.0 g/dL  HCT 51.2 (H) 36.0 - 46.0 %   MCV 96.2 80.0 - 100.0 fL   MCH 29.7 26.0 - 34.0 pg   MCHC 30.9 30.0 - 36.0 g/dL   RDW 16.1 09.6 - 04.5 %   Platelets 378 150 - 400 K/uL   nRBC 0.0 0.0 - 0.2 %   Neutrophils Relative % 88 %   Neutro Abs 15.1 (H) 1.7 - 7.7 K/uL   Lymphocytes Relative 3 %   Lymphs Abs 0.6 (L) 0.7 - 4.0 K/uL   Monocytes Relative 7 %   Monocytes Absolute 1.3 (H) 0.1 - 1.0 K/uL   Eosinophils Relative 0 %   Eosinophils Absolute 0.0 0.0 - 0.5 K/uL   Basophils Relative 0 %   Basophils Absolute 0.1 0.0 - 0.1 K/uL   Immature Granulocytes 2 %   Abs Immature Granulocytes 0.32 (H) 0.00 - 0.07 K/uL    Comment: Performed at [facility], [address] [place], [place] [address]  CK     Status: None   Collection Time: 07/28/18  8:53 PM  Result Value Ref Range   Total CK 60 38 - 234 U/L    Comment: Performed at [facility], [address] [place], [place] [address]  I-Stat beta hCG  blood, ED     Status: None   Collection Time: 07/28/18  9:01 PM  Result Value Ref Range   I-stat hCG, quantitative <5.0 <5 mIU/mL   Comment 3            Comment:   GEST. AGE      CONC.  (mIU/mL)   <=1 WEEK        5 - 50     2 WEEKS       50 - 500     3 WEEKS       100 - 10,000     4 WEEKS     1,000 - 30,000        FEMALE AND NON-PREGNANT FEMALE:     LESS THAN 5 mIU/mL   Urine rapid drug screen (hosp performed)     Status: Abnormal   Collection Time: 07/29/18  1:03 AM  Result Value Ref Range   Opiates NONE DETECTED NONE DETECTED   Cocaine NONE DETECTED NONE DETECTED   Benzodiazepines POSITIVE (A) NONE DETECTED   Amphetamines NONE DETECTED NONE DETECTED   Tetrahydrocannabinol NONE DETECTED NONE DETECTED   Barbiturates NONE DETECTED NONE DETECTED    Comment: (NOTE) DRUG SCREEN FOR MEDICAL PURPOSES ONLY.  IF CONFIRMATION IS NEEDED FOR ANY PURPOSE, NOTIFY LAB WITHIN 5 DAYS. LOWEST DETECTABLE LIMITS FOR URINE DRUG SCREEN Drug Class                     Cutoff (ng/mL) Amphetamine and metabolites    1000 Barbiturate and metabolites    200 Benzodiazepine                 200 Tricyclics and metabolites     300 Opiates and metabolites        300 Cocaine and metabolites        300 THC                            50 Performed at [facility], [address] [place], [place] [address]   Urinalysis, Routine w reflex microscopic     Status: Abnormal   Collection Time: 07/29/18  1:03 AM  Result Value Ref Range   Color, Urine YELLOW YELLOW   APPearance CLOUDY (A) CLEAR   Specific Gravity, Urine 1.014 1.005 - 1.030   pH 5.0 5.0 - 8.0   Glucose, UA NEGATIVE NEGATIVE mg/dL   Hgb urine dipstick SMALL (A) NEGATIVE   Bilirubin Urine NEGATIVE NEGATIVE   Ketones, ur NEGATIVE NEGATIVE mg/dL   Protein, ur NEGATIVE NEGATIVE mg/dL   Nitrite NEGATIVE NEGATIVE   Leukocytes, UA NEGATIVE NEGATIVE   RBC / HPF 0-5 0 - 5 RBC/hpf   WBC, UA 21-50 0 - 5 WBC/hpf   Bacteria, UA RARE (A)  NONE SEEN   Squamous Epithelial / LPF 11-20 0 - 5   WBC Clumps PRESENT    Mucus PRESENT    Hyaline Casts, UA PRESENT     Comment: Performed at [facility], [address], [place], [place] [address]  TSH     Status: Abnormal   Collection Time: 07/29/18  1:03 AM  Result Value Ref Range   TSH 0.247 (L) 0.350 - 4.500 uIU/mL    Comment: Performed by a 3rd Generation assay with a functional sensitivity of <=0.01 uIU/mL. Performed at [facility], [address], [place], [place] [address]   Troponin I - Now Then Q6H     Status: Abnormal   Collection Time: 07/29/18  1:03 AM  Result Value Ref Range   Troponin I 0.88 (HH) <0.03 ng/mL    Comment: CRITICAL RESULT CALLED TO, READ BACK BY AND VERIFIED WITH: OXENDINE,J RN @0158  ON 07/29/18 JACKSON,K Performed at [facility], [address], [place], [place] [address]   D-dimer, quantitative (not at [facility])     Status: Abnormal   Collection Time: 07/29/18  1:03 AM  Result Value Ref Range   D-Dimer, Quant 4.54 (H) 0.00 - 0.50 ug/mL-FEU    Comment: (NOTE) At the manufacturer cut-off of 0.50 ug/mL FEU, this assay has been documented to exclude PE with a sensitivity and negative predictive value of 97 to 99%.  At this time, this assay has not been approved by the FDA to exclude DVT/VTE. Results should be correlated with clinical presentation. Performed at [facility], [address] [place], [place] [address]   Lactic acid, plasma     Status: Abnormal   Collection Time: 07/29/18  1:03 AM  Result Value Ref Range   Lactic Acid, Venous 2.3 (HH) 0.5 - 1.9 mmol/L    Comment: CRITICAL RESULT CALLED TO, READ BACK BY AND VERIFIED WITH: OXENDINE,J RN @ 0158 ON 07/29/18 JACKSON,K Performed at [facility], [address] [place], [place] [address]    Ct Head Wo Contrast  Result Date: 07/28/2018 CLINICAL DATA:  42 year old female with altered mental  status. EXAM: CT HEAD WITHOUT CONTRAST TECHNIQUE: Contiguous axial images were obtained from the base of the skull through the vertex without intravenous contrast. COMPARISON:  None. FINDINGS: Brain: The ventricles and sulci appropriate size for patient's age. The gray-white matter discrimination is preserved. There is no acute intracranial hemorrhage. No mass effect or midline shift. No extra-axial fluid collection. Vascular: No hyperdense vessel or unexpected calcification. Skull: Normal. Negative for fracture or focal lesion. Sinuses/Orbits: Bilateral maxillary sinus retention cyst or polyp. The mastoid air cells are clear. Other: None IMPRESSION: No acute intracranial pathology. Electronically Signed   By: Elgie Collard M.D.   On: 07/28/2018 23:42   Dg Chest Portable 1 View  Result Date: 07/28/2018 CLINICAL DATA:  42 year old  female with hypoxia. EXAM: PORTABLE CHEST 1 VIEW COMPARISON:  None. FINDINGS: Minimal increased density is over the left lower lung field, likely atelectatic changes. Developing infiltrate is less likely but not excluded. Clinical correlation is recommended. No focal consolidation, pleural effusion, or pneumothorax. The cardiac silhouette is within normal limits. No acute osseous pathology. IMPRESSION: Minimal left lung base atelectasis, less likely developing infiltrate. Electronically Signed   By: Elgie CollardArash  Radparvar M.D.   On: 07/28/2018 21:31    Pending Labs Unresulted Labs (From admission, onward)    Start     Ordered   07/29/18 0500  Basic metabolic panel  Tomorrow morning,   R     07/29/18 0057   07/29/18 0500  CBC  Tomorrow morning,   R     07/29/18 0057   07/29/18 0500  Hepatic function panel  Tomorrow morning,   R     07/29/18 0057   07/29/18 0114  Culture, blood (routine x 2) Call MD if unable to obtain prior to antibiotics being given  BLOOD CULTURE X 2,   R    Comments:  If blood cultures drawn in Emergency Department - Do not draw and cancel order     07/29/18 0113   07/29/18 0114  Culture, sputum-assessment  Once,   R     07/29/18 0113   07/29/18 0114  Gram stain  Once,   R     07/29/18 0113   07/29/18 0114  Strep pneumoniae urinary antigen  Once,   R     07/29/18 0113   07/29/18 0114  Legionella Pneumophila Serogp 1 Ur Ag  Once,   R     07/29/18 0113   07/29/18 0058  Lactic acid, plasma  STAT Now then every 3 hours,   R     07/29/18 0057   07/29/18 0057  Troponin I - Now Then Q6H  Now then every 6 hours,   R     07/29/18 0057   07/29/18 0055  HIV antibody (Routine Testing)  Once,   R     07/29/18 0057          Vitals/Pain Today's Vitals   07/29/18 0333 07/29/18 0345 07/29/18 0430 07/29/18 0512  BP: 119/76 101/72 (!) 90/57 111/74  Pulse: (!) 115 (!) 117 (!) 120 (!) 117  Resp: 13 16 15 14   Temp:      SpO2: 96% 96% 97% 91%  PainSc:        Isolation Precautions No active isolations  Medications Medications  zolpidem (AMBIEN) tablet 5 mg (has no administration in time range)  acetaminophen (TYLENOL) tablet 650 mg (has no administration in time range)    Or  acetaminophen (TYLENOL) suppository 650 mg (has no administration in time range)  ondansetron (ZOFRAN) tablet 4 mg (has no administration in time range)    Or  ondansetron (ZOFRAN) injection 4 mg (has no administration in time range)  0.9 %  sodium chloride infusion ( Intravenous New Bag/Given 07/29/18 0220)  cefTRIAXone (ROCEPHIN) 1 g in sodium chloride 0.9 % 100 mL IVPB (0 g Intravenous Stopped 07/29/18 0352)  azithromycin (ZITHROMAX) 500 mg in sodium chloride 0.9 % 250 mL IVPB ( Intravenous Restarted 07/29/18 0455)  sodium chloride 0.9 % bolus 500 mL (500 mLs Intravenous New Bag/Given 07/29/18 0457)  levalbuterol (XOPENEX) nebulizer solution 0.63 mg (0.63 mg Nebulization Given 07/29/18 0410)  sodium chloride 0.9 % bolus 1,000 mL (0 mLs Intravenous Stopped 07/28/18 2327)  sodium chloride 0.9 % bolus 1,000 mL (  0 mLs Intravenous Stopped 07/29/18 0155)     Mobility walks

## 2018-07-29 NOTE — Progress Notes (Signed)
Per central tele, patient had 31 run of wide QRS. New orders placed by MD.  Will continue to monitor.

## 2018-07-30 LAB — MAGNESIUM: Magnesium: 2.2 mg/dL (ref 1.7–2.4)

## 2018-07-30 LAB — URINE CULTURE: Culture: NO GROWTH

## 2018-07-30 LAB — BASIC METABOLIC PANEL
Anion gap: 9 (ref 5–15)
BUN: 7 mg/dL (ref 6–20)
CO2: 21 mmol/L — ABNORMAL LOW (ref 22–32)
Calcium: 7.8 mg/dL — ABNORMAL LOW (ref 8.9–10.3)
Chloride: 110 mmol/L (ref 98–111)
Creatinine, Ser: 0.62 mg/dL (ref 0.44–1.00)
GFR calc Af Amer: >60 mL/min (ref >60)
GFR calc non Af Amer: >60 mL/min (ref >60)
Glucose, Bld: 113 mg/dL — ABNORMAL HIGH (ref 70–99)
Potassium: 4 mmol/L (ref 3.5–5.1)
Sodium: 140 mmol/L (ref 135–145)

## 2018-07-30 LAB — CBC
HCT: 37.4 % (ref 36.0–46.0)
Hemoglobin: 11.9 g/dL — ABNORMAL LOW (ref 12.0–15.0)
MCH: 29.8 pg (ref 26.0–34.0)
MCHC: 31.8 g/dL (ref 30.0–36.0)
MCV: 93.7 fL (ref 80.0–100.0)
PLATELETS: 164 10*3/uL (ref 150–400)
RBC: 3.99 MIL/uL (ref 3.87–5.11)
RDW: 12.6 % (ref 11.5–15.5)
WBC: 13.7 10*3/uL — ABNORMAL HIGH (ref 4.0–10.5)
nRBC: 0 % (ref 0.0–0.2)

## 2018-07-30 LAB — BLOOD CULTURE ID PANEL (REFLEXED)
Acinetobacter baumannii: NOT DETECTED
Candida albicans: NOT DETECTED
Candida glabrata: NOT DETECTED
Candida krusei: NOT DETECTED
Candida parapsilosis: NOT DETECTED
Candida tropicalis: NOT DETECTED
Enterobacter cloacae complex: NOT DETECTED
Enterobacteriaceae species: NOT DETECTED
Enterococcus species: NOT DETECTED
Escherichia coli: NOT DETECTED
Haemophilus influenzae: NOT DETECTED
Klebsiella oxytoca: NOT DETECTED
Klebsiella pneumoniae: NOT DETECTED
LISTERIA MONOCYTOGENES: NOT DETECTED
NEISSERIA MENINGITIDIS: NOT DETECTED
PSEUDOMONAS AERUGINOSA: NOT DETECTED
Proteus species: NOT DETECTED
Serratia marcescens: NOT DETECTED
Staphylococcus aureus (BCID): NOT DETECTED
Staphylococcus species: NOT DETECTED
Streptococcus agalactiae: NOT DETECTED
Streptococcus pneumoniae: NOT DETECTED
Streptococcus pyogenes: NOT DETECTED
Streptococcus species: DETECTED — AB

## 2018-07-30 LAB — LEGIONELLA PNEUMOPHILA SEROGP 1 UR AG: L. pneumophila Serogp 1 Ur Ag: NEGATIVE

## 2018-07-30 LAB — T3, FREE: T3, Free: 1.7 pg/mL — ABNORMAL LOW (ref 2.0–4.4)

## 2018-07-30 MED ORDER — OXYCODONE HCL 5 MG PO TABS
5.0000 mg | ORAL_TABLET | Freq: Three times a day (TID) | ORAL | Status: DC | PRN
  Administered 2018-07-30 – 2018-07-31 (×4): 5 mg via ORAL
  Filled 2018-07-30 (×4): qty 1

## 2018-07-30 MED ORDER — KETOROLAC TROMETHAMINE 30 MG/ML IJ SOLN
30.0000 mg | Freq: Once | INTRAMUSCULAR | Status: AC
  Administered 2018-07-30: 30 mg via INTRAVENOUS
  Filled 2018-07-30: qty 1

## 2018-07-30 MED ORDER — CLONIDINE HCL 0.1 MG PO TABS: 0.1000 mg | ORAL_TABLET | Freq: Every day | ORAL | Status: DC

## 2018-07-30 MED ORDER — LOPERAMIDE HCL 2 MG PO CAPS: 2.0000 mg | ORAL_CAPSULE | ORAL | Status: DC | PRN

## 2018-07-30 MED ORDER — HYDROXYZINE HCL 25 MG PO TABS
25.0000 mg | ORAL_TABLET | Freq: Four times a day (QID) | ORAL | Status: DC | PRN
  Administered 2018-07-31 – 2018-08-01 (×2): 25 mg via ORAL
  Filled 2018-07-30 (×2): qty 1

## 2018-07-30 MED ORDER — LEVALBUTEROL HCL 0.63 MG/3ML IN NEBU
0.6300 mg | INHALATION_SOLUTION | RESPIRATORY_TRACT | Status: DC | PRN
  Administered 2018-07-31: 0.63 mg via RESPIRATORY_TRACT
  Filled 2018-07-30: qty 3

## 2018-07-30 MED ORDER — DIPHENHYDRAMINE HCL 50 MG/ML IJ SOLN
25.0000 mg | Freq: Once | INTRAMUSCULAR | Status: AC
  Administered 2018-07-30: 25 mg via INTRAVENOUS
  Filled 2018-07-30: qty 1

## 2018-07-30 MED ORDER — ASPIRIN-ACETAMINOPHEN-CAFFEINE 250-250-65 MG PO TABS
1.0000 | ORAL_TABLET | Freq: Four times a day (QID) | ORAL | Status: DC | PRN
  Administered 2018-07-30 – 2018-07-31 (×5): 1 via ORAL
  Filled 2018-07-30 (×7): qty 1

## 2018-07-30 MED ORDER — LEVALBUTEROL HCL 0.63 MG/3ML IN NEBU
0.6300 mg | INHALATION_SOLUTION | Freq: Three times a day (TID) | RESPIRATORY_TRACT | Status: DC
  Administered 2018-07-30 (×2): 0.63 mg via RESPIRATORY_TRACT
  Filled 2018-07-30 (×2): qty 3

## 2018-07-30 MED ORDER — ONDANSETRON 4 MG PO TBDP: 4.0000 mg | ORAL_TABLET | Freq: Four times a day (QID) | ORAL | Status: DC | PRN

## 2018-07-30 MED ORDER — CLONIDINE HCL 0.1 MG PO TABS: 0.1000 mg | ORAL_TABLET | ORAL | Status: DC

## 2018-07-30 MED ORDER — SODIUM CHLORIDE 0.9 % IV SOLN
INTRAVENOUS | Status: DC
  Administered 2018-07-30 – 2018-07-31 (×2): via INTRAVENOUS

## 2018-07-30 MED ORDER — SODIUM CHLORIDE 0.9 % IV SOLN
2.0000 g | INTRAVENOUS | Status: DC
  Administered 2018-07-30 – 2018-08-01 (×3): 2 g via INTRAVENOUS
  Filled 2018-07-30 (×2): qty 20
  Filled 2018-07-30: qty 2
  Filled 2018-07-30: qty 20
  Filled 2018-07-30: qty 2

## 2018-07-30 MED ORDER — DICYCLOMINE HCL 20 MG PO TABS
20.0000 mg | ORAL_TABLET | Freq: Four times a day (QID) | ORAL | Status: DC | PRN
  Filled 2018-07-30: qty 1

## 2018-07-30 MED ORDER — METOCLOPRAMIDE HCL 5 MG/ML IJ SOLN
10.0000 mg | Freq: Once | INTRAMUSCULAR | Status: AC
  Administered 2018-07-30: 10 mg via INTRAVENOUS
  Filled 2018-07-30: qty 2

## 2018-07-30 MED ORDER — METHOCARBAMOL 500 MG PO TABS: 500.0000 mg | ORAL_TABLET | Freq: Three times a day (TID) | ORAL | Status: DC | PRN

## 2018-07-30 MED ORDER — CLONIDINE HCL 0.1 MG PO TABS
0.1000 mg | ORAL_TABLET | Freq: Four times a day (QID) | ORAL | Status: DC
  Administered 2018-07-30 (×2): 0.1 mg via ORAL
  Filled 2018-07-30 (×2): qty 1

## 2018-07-30 MED ORDER — OXYCODONE HCL 5 MG PO TABS: 5.0000 mg | ORAL_TABLET | Freq: Four times a day (QID) | ORAL | Status: DC | PRN

## 2018-07-30 MED ORDER — CLONIDINE HCL 0.1 MG PO TABS: 0.1000 mg | ORAL_TABLET | Freq: Two times a day (BID) | ORAL | Status: DC
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
## 2018-07-30 NOTE — Progress Notes (Signed)
PHARMACY - PHYSICIAN COMMUNICATION CRITICAL VALUE ALERT - BLOOD CULTURE IDENTIFICATION (BCID)  Jeronimo GreavesMarjorie Balsley is an 42 y.o. female who [facility] with a chief complaint of loss of consciousness.    Assessment: patient on CAP treatment with ceftriaxone/azithromycin.  BCx currently 1/2 growing Strep spp.    Name of physician (or Provider) Contacted: Regalado  Current antibiotics: ceftriaxone and azithromycin   Changes to prescribed antibiotics recommended:  Patient is on recommended antibiotics - No changes needed (increase dose of ceftriaxone to 2gm)  Results for orders placed or performed during the hospital encounter of 07/28/18  Blood Culture ID Panel (Reflexed) (Collected: 07/29/2018  1:14 AM)  Result Value Ref Range   Enterococcus species NOT DETECTED NOT DETECTED   Listeria monocytogenes NOT DETECTED NOT DETECTED   Staphylococcus species NOT DETECTED NOT DETECTED   Staphylococcus aureus (BCID) NOT DETECTED NOT DETECTED   Streptococcus species DETECTED (A) NOT DETECTED   Streptococcus agalactiae NOT DETECTED NOT DETECTED   Streptococcus pneumoniae NOT DETECTED NOT DETECTED   Streptococcus pyogenes NOT DETECTED NOT DETECTED   Acinetobacter baumannii NOT DETECTED NOT DETECTED   Enterobacteriaceae species NOT DETECTED NOT DETECTED   Enterobacter cloacae complex NOT DETECTED NOT DETECTED   Escherichia coli NOT DETECTED NOT DETECTED   Klebsiella oxytoca NOT DETECTED NOT DETECTED   Klebsiella pneumoniae NOT DETECTED NOT DETECTED   Proteus species NOT DETECTED NOT DETECTED   Serratia marcescens NOT DETECTED NOT DETECTED   Haemophilus influenzae NOT DETECTED NOT DETECTED   Neisseria meningitidis NOT DETECTED NOT DETECTED   Pseudomonas aeruginosa NOT DETECTED NOT DETECTED   Candida albicans NOT DETECTED NOT DETECTED   Candida glabrata NOT DETECTED NOT DETECTED   Candida krusei NOT DETECTED NOT DETECTED   Candida parapsilosis NOT DETECTED NOT DETECTED   Candida tropicalis NOT DETECTED NOT DETECTED    Juliette Alcideustin Jackie Littlejohn, PharmD, BCPS.   Work Cell: [phone] 07/30/2018 10:01 AM

## 2018-07-30 NOTE — Care Management Note (Signed)
Case Management Note  Patient Details  Name: Denise GreavesMarjorie Vazquez MRN: [id number] Date of Birth: 09/24/1975  Subjective/Objective:                  overdose  Action/Plan: Will follow for progression of care and clinical status. Will follow for case management needs none present at this time.  Expected Discharge Date:  (unknown)               Expected Discharge Plan:  Home/Self Care  In-House Referral:     Discharge planning Services  CM Consult  Post Acute Care Choice:    Choice offered to:  Patient  DME Arranged:    DME Agency:     HH Arranged:    HH Agency:     Status of Service:  In process, will continue to follow  If discussed at Long Length of Stay Meetings, dates discussed:    Additional Comments:  Golda AcreDavis, Rhonda Lynn, RN 07/30/2018, 9:37 AM

## 2018-07-31 DIAGNOSIS — Z888 Allergy status to other drugs, medicaments and biological substances status: Secondary | ICD-10-CM

## 2018-07-31 DIAGNOSIS — Z79891 Long term (current) use of opiate analgesic: Secondary | ICD-10-CM

## 2018-07-31 DIAGNOSIS — D72829 Elevated white blood cell count, unspecified: Secondary | ICD-10-CM

## 2018-07-31 DIAGNOSIS — R7881 Bacteremia: Secondary | ICD-10-CM

## 2018-07-31 DIAGNOSIS — T50901S Poisoning by unspecified drugs, medicaments and biological substances, accidental (unintentional), sequela: Secondary | ICD-10-CM

## 2018-07-31 DIAGNOSIS — J45909 Unspecified asthma, uncomplicated: Secondary | ICD-10-CM

## 2018-07-31 DIAGNOSIS — G8921 Chronic pain due to trauma: Secondary | ICD-10-CM

## 2018-07-31 DIAGNOSIS — Z87828 Personal history of other (healed) physical injury and trauma: Secondary | ICD-10-CM

## 2018-07-31 DIAGNOSIS — B954 Other streptococcus as the cause of diseases classified elsewhere: Secondary | ICD-10-CM

## 2018-07-31 DIAGNOSIS — M549 Dorsalgia, unspecified: Secondary | ICD-10-CM

## 2018-07-31 LAB — CBC
HCT: 33.1 % — ABNORMAL LOW (ref 36.0–46.0)
Hemoglobin: 10.8 g/dL — ABNORMAL LOW (ref 12.0–15.0)
MCH: 29.4 pg (ref 26.0–34.0)
MCHC: 32.6 g/dL (ref 30.0–36.0)
MCV: 90.2 fL (ref 80.0–100.0)
NRBC: 0 % (ref 0.0–0.2)
PLATELETS: 210 10*3/uL (ref 150–400)
RBC: 3.67 MIL/uL — ABNORMAL LOW (ref 3.87–5.11)
RDW: 12.4 % (ref 11.5–15.5)
WBC: 11.2 10*3/uL — ABNORMAL HIGH (ref 4.0–10.5)

## 2018-07-31 LAB — BASIC METABOLIC PANEL
Anion gap: 9 (ref 5–15)
BUN: 7 mg/dL (ref 6–20)
CO2: 25 mmol/L (ref 22–32)
Calcium: 8.1 mg/dL — ABNORMAL LOW (ref 8.9–10.3)
Chloride: 109 mmol/L (ref 98–111)
Creatinine, Ser: 0.54 mg/dL (ref 0.44–1.00)
GFR calc Af Amer: >60 mL/min (ref >60)
GFR calc non Af Amer: >60 mL/min (ref >60)
Glucose, Bld: 114 mg/dL — ABNORMAL HIGH (ref 70–99)
POTASSIUM: 3.1 mmol/L — AB (ref 3.5–5.1)
Sodium: 143 mmol/L (ref 135–145)

## 2018-07-31 MED ORDER — OXYCODONE HCL 5 MG PO TABS
5.0000 mg | ORAL_TABLET | Freq: Three times a day (TID) | ORAL | Status: DC | PRN
  Administered 2018-07-31 – 2018-08-02 (×5): 5 mg via ORAL
  Filled 2018-07-31 (×5): qty 1

## 2018-07-31 MED ORDER — KETOROLAC TROMETHAMINE 15 MG/ML IJ SOLN
7.5000 mg | Freq: Three times a day (TID) | INTRAMUSCULAR | Status: AC | PRN
  Administered 2018-07-31 – 2018-08-01 (×3): 7.5 mg via INTRAVENOUS
  Filled 2018-07-31 (×4): qty 1

## 2018-07-31 MED ORDER — AMLODIPINE BESYLATE 5 MG PO TABS
5.0000 mg | ORAL_TABLET | Freq: Every day | ORAL | Status: DC
  Administered 2018-07-31 – 2018-08-01 (×2): 5 mg via ORAL
  Filled 2018-07-31 (×2): qty 1

## 2018-07-31 MED ORDER — HYDRALAZINE HCL 20 MG/ML IJ SOLN
10.0000 mg | Freq: Once | INTRAMUSCULAR | Status: AC
  Administered 2018-07-31: 10 mg via INTRAVENOUS
  Filled 2018-07-31: qty 1

## 2018-07-31 MED ORDER — LEVALBUTEROL HCL 0.63 MG/3ML IN NEBU
0.6300 mg | INHALATION_SOLUTION | Freq: Two times a day (BID) | RESPIRATORY_TRACT | Status: DC
  Administered 2018-07-31 – 2018-08-01 (×2): 0.63 mg via RESPIRATORY_TRACT
  Filled 2018-07-31 (×3): qty 3

## 2018-07-31 MED ORDER — POTASSIUM CHLORIDE CRYS ER 20 MEQ PO TBCR
40.0000 meq | EXTENDED_RELEASE_TABLET | Freq: Once | ORAL | Status: AC
  Administered 2018-07-31: 40 meq via ORAL
  Filled 2018-07-31: qty 2

## 2018-07-31 MED ORDER — LEVALBUTEROL HCL 0.63 MG/3ML IN NEBU: 0.6300 mg | INHALATION_SOLUTION | Freq: Two times a day (BID) | RESPIRATORY_TRACT | Status: DC

## 2018-07-31 NOTE — Progress Notes (Addendum)
PROGRESS NOTE   Denise Vazquez  RUE:[id number]    DOB: [birth year]    DOA: 07/28/2018  PCP: Loyal Jacobson, MD   I have briefly reviewed patients previous medical records in [facility].  Brief Narrative:  42 year old female patient with PMH of chronic pain for several years, used to see pain management 2 years ago but not since then, has been using Duragesic patch or oral narcotics from her friends, brought to the ER after she was found unresponsive in a hotel, minimal response to Narcan.  In ED, WBC 17.3, creatinine 2.3, anion gap 17, lactate 2.3, BAL negative, urine pregnancy test negative, CT head negative, chest x-ray suggested possible developing infiltrates.  She was admitted for suspected acute encephalopathy possibly from some substance OD.  Acute encephalopathy resolved.  Ongoing chronic pain issues.  Elevated blood pressures.  Blood cultures x1 shows strep species, final results pending.  ID consulted.   Assessment & Plan:   Principal Problem:   Acute drug overdose Active Problems:   Acute encephalopathy   Tachycardia   Chronic back pain   ARF (acute renal failure) (HCC)   Acute encephalopathy: Unclear etiology but suspected due to unknown drug overdose.  She reported taking oxycodone 20 mg x 2 tablets for pain.  Received Narcan with minimal response.  CT head and EEG negative.  Counseled extensively regarding avoiding prescription or nonprescription medication abuse and she verbalized understanding.  Resolved.  Interestingly UDS only positive for benzodiazepines.  Suspected aspiration pneumonia: Probably during episode of unresponsiveness.  Currently on IV ceftriaxone.  Improved.  Follow chest x-ray in 4 weeks to ensure.  Strep viridans bacteremia with sepsis: Unclear source.?  Dental caries.  1 of 2 blood cultures positive for strep viridans.  Continue IV ceftriaxone.  ID consulted to see if she needs any further evaluation and recommendations for management.  Dental  caries: Recommended outpatient dental consultation and she verbalized understanding.  Acute kidney injury: Suspected due to hypovolemia.  Resolved.  Monitor BMP closely.  Normocytic anemia: Some of this may be dilutional.  Relatively stable.  Follow CBC in a.m.  Leukocytosis: Secondary to infectious etiology as noted above.  Resolved.  Hypokalemia: Replace and follow.  Hypomagnesemia: Replaced.  Elevated troponin and d-dimer: No report of chest pain.  TTE 07/29/2018: LVEF 60-65%, normal wall motion and no regional wall motion abnormalities, grade 1 diastolic dysfunction.  VQ scan negative for PE.  She was briefly on IV heparin, now discontinued.  Asthma exacerbation: Continue nebulizations.  Solu-Medrol discontinued.  Resolved.  Chronic pain: Will need to follow-up with pain management as outpatient.  Pain not adequately controlled on as needed OxyIR.  Added brief IV Toradol with good effect.  Monitor BMP closely.  Elevated blood pressure/possible hypertension: No prior history of hypertension or taking medications for same.  Could be precipitated by pain but seems to be elevated even after pain better controlled.  Start amlodipine 5 mg daily and monitor.  Suspected drug overdose: Social work consulted for outpatient resources.  No features of overt withdrawal.  Psychiatry consultation 12/9 appreciated who discussed Suboxone treatment with patient for opioid abuse and chronic pain and recommended social work provide information for Suboxone clinics as well as substance abuse treatment resources.  Clonidine protocol recommended if experiencing opioid withdrawal, none seen at this time.  No suicidal or homicidal ideations noted.  Sinus tachycardia: Likely due to infectious etiology.  Improved.    DVT prophylaxis: SCDs Code Status: Full Family Communication: None at bedside Disposition: To be  determined pending clinical improvement.   Consultants:  Infectious disease  Procedures:    None  Antimicrobials:  IV ceftriaxone   Subjective: Patient reports ongoing chronic low back pain, right foot pain, headache.  Pain improved after dose of IV Toradol.  Denies toothache.  States that she is urinating frequently due to IV fluids and would like it stopped if possible.  ROS: As above, otherwise negative.  Objective:  Vitals:   07/31/18 0804 07/31/18 0805 07/31/18 1151 07/31/18 1338  BP: (!) 153/102 (!) 153/102 (!) 140/103 (!) 131/95  Pulse:  (!) 113 91 94  Resp:  18 14 16   Temp:  98.2 F (36.8 C)  97.6 F (36.4 C)  TempSrc:  Oral  Oral  SpO2:  97% 98% 98%  Weight:      Height:        Examination:  General exam: Pleasant young female, moderately built and nourished lying comfortably propped up in bed without distress. Respiratory system: Clear to auscultation. Respiratory effort normal. Cardiovascular system: S1 & S2 heard, RRR. No JVD, murmurs, rubs, gallops or clicks. No pedal edema.  Telemetry personally reviewed: Sinus rhythm.  Occasional sinus tachycardia in the 110s. Gastrointestinal system: Abdomen is nondistended, soft and nontender. No organomegaly or masses felt. Normal bowel sounds heard. Central nervous system: Alert and oriented. No focal neurological deficits. Extremities: Symmetric 5 x 5 power. Skin: No rashes, lesions or ulcers Psychiatry: Judgement and insight appear normal. Mood & affect appropriate.  ENT: Right upper second molar almost completely destroyed by caries and may be right upper third molar also involved.    Data Reviewed: I have personally reviewed following labs and imaging studies  CBC: Recent Labs  Lab 07/28/18 2053 07/29/18 0659 07/30/18 0253 07/31/18 0530  WBC 17.3* 16.9* 13.7* 11.2*  NEUTROABS 15.1*  --   --   --   HGB 15.8* 11.6* 11.9* 10.8*  HCT 51.2* 35.6* 37.4 33.1*  MCV 96.2 94.9 93.7 90.2  PLT 378 208 164 210   Basic Metabolic Panel: Recent Labs  Lab 07/28/18 2053 07/29/18 0659 07/29/18 0843  07/30/18 0253 07/31/18 0530  NA 144 141  --  140 143  K 3.7 3.8  --  4.0 3.1*  CL 104 112*  --  110 109  CO2 23 20*  --  21* 25  GLUCOSE 80 138*  --  113* 114*  BUN 19 21*  --  7 7  CREATININE 2.30* 1.25*  --  0.62 0.54  CALCIUM 8.9 7.3*  --  7.8* 8.1*  MG  --   --  1.6* 2.2  --    Liver Function Tests: Recent Labs  Lab 07/28/18 2053 07/29/18 0659  AST 44* 43*  ALT 24 22  ALKPHOS 80 35*  BILITOT 0.4 0.4  PROT 7.4 5.3*  ALBUMIN 4.4 3.1*   Coagulation Profile: Recent Labs  Lab 07/29/18 0843  INR 1.15   Cardiac Enzymes: Recent Labs  Lab 07/28/18 2053 07/29/18 0103 07/29/18 0659 07/29/18 1243 07/29/18 1802  CKTOTAL 60  --   --   --   --   TROPONINI  --  0.88* 0.75* 0.54* 0.40*     Recent Results (from the past 240 hour(s))  Culture, blood (routine x 2) Call MD if unable to obtain prior to antibiotics being given     Status: Abnormal (Preliminary result)   Collection Time: 07/29/18  1:14 AM  Result Value Ref Range Status   Specimen Description   Final    BLOOD  RIGHT ANTECUBITAL Performed at [facility], [address], [place], [place] [address]    Special Requests   Final    BOTTLES DRAWN AEROBIC AND ANAEROBIC Blood Culture adequate volume Performed at [facility], [address], [place], [place] [address]    Culture  Setup Time   Final    GRAM POSITIVE COCCI IN CHAINS IN BOTH AEROBIC AND ANAEROBIC BOTTLES CRITICAL RESULT CALLED TO, READ BACK BY AND VERIFIED WITH: A. PHAM, PHARMD AT 0930 ON 07/30/18 BY C. JESSUP, MLT. Performed at [facility], [address], [place], [place] [address]    Culture VIRIDANS STREPTOCOCCUS (A)  Final   Report Status PENDING  Incomplete  Blood Culture ID Panel (Reflexed)     Status: Abnormal   Collection Time: 07/29/18  1:14 AM  Result Value Ref Range Status   Enterococcus species NOT DETECTED NOT DETECTED Final   Listeria monocytogenes NOT DETECTED NOT DETECTED Final    Staphylococcus species NOT DETECTED NOT DETECTED Final   Staphylococcus aureus (BCID) NOT DETECTED NOT DETECTED Final   Streptococcus species DETECTED (A) NOT DETECTED Final    Comment: Not Enterococcus species, Streptococcus agalactiae, Streptococcus pyogenes, or Streptococcus pneumoniae. CRITICAL RESULT CALLED TO, READ BACK BY AND VERIFIED WITH: A. PHAM, PHARMD AT 0930 ON 07/30/18 BY C. JESSUP, MLT.    Streptococcus agalactiae NOT DETECTED NOT DETECTED Final   Streptococcus pneumoniae NOT DETECTED NOT DETECTED Final   Streptococcus pyogenes NOT DETECTED NOT DETECTED Final   Acinetobacter baumannii NOT DETECTED NOT DETECTED Final   Enterobacteriaceae species NOT DETECTED NOT DETECTED Final   Enterobacter cloacae complex NOT DETECTED NOT DETECTED Final   Escherichia coli NOT DETECTED NOT DETECTED Final   Klebsiella oxytoca NOT DETECTED NOT DETECTED Final   Klebsiella pneumoniae NOT DETECTED NOT DETECTED Final   Proteus species NOT DETECTED NOT DETECTED Final   Serratia marcescens NOT DETECTED NOT DETECTED Final   Haemophilus influenzae NOT DETECTED NOT DETECTED Final   Neisseria meningitidis NOT DETECTED NOT DETECTED Final   Pseudomonas aeruginosa NOT DETECTED NOT DETECTED Final   Candida albicans NOT DETECTED NOT DETECTED Final   Candida glabrata NOT DETECTED NOT DETECTED Final   Candida krusei NOT DETECTED NOT DETECTED Final   Candida parapsilosis NOT DETECTED NOT DETECTED Final   Candida tropicalis NOT DETECTED NOT DETECTED Final    Comment: Performed at [facility], [address] [place], [place] [address]  Culture, blood (routine x 2) Call MD if unable to obtain prior to antibiotics being given     Status: None (Preliminary result)   Collection Time: 07/29/18  5:03 AM  Result Value Ref Range Status   Specimen Description   Final    BLOOD LEFT HAND Performed at [facility], [address], [place], [place] [address]    Special Requests   Final     BOTTLES DRAWN AEROBIC AND ANAEROBIC Blood Culture results may not be optimal due to an excessive volume of blood received in culture bottles Performed at [facility], [address] [place], [place] [address]    Culture   Final    NO GROWTH 2 DAYS Performed at [facility], [address], [place], [place] [address]    Report Status PENDING  Incomplete  Culture, sputum-assessment     Status: None   Collection Time: 07/29/18  5:25 AM  Result Value Ref Range Status   Specimen Description SPUTUM  Final  Special Requests NONE  Final   Sputum evaluation   Final    Sputum specimen not acceptable for testing.  Please recollect.   JODY ED ON [id number] @0558  BY MCCOY,N Performed at [facility], [address] [place], [place] [address]    Report Status 07/29/2018 FINAL  Final  MRSA PCR Screening     Status: None   Collection Time: 07/29/18  6:59 AM  Result Value Ref Range Status   MRSA by PCR NEGATIVE NEGATIVE Final    Comment:        The GeneXpert MRSA Assay (FDA approved for NASAL specimens only), is one component of a comprehensive MRSA colonization surveillance program. It is not intended to diagnose MRSA infection nor to guide or monitor treatment for MRSA infections. Performed at [facility], [address] [place], [place] [address]   Urine Culture     Status: None   Collection Time: 07/29/18  9:03 AM  Result Value Ref Range Status   Specimen Description   Final    URINE, RANDOM Performed at [facility], [address] [place], [place] [address]    Special Requests   Final    NONE Performed at [facility], [address] [place], [place] [address]    Culture   Final    NO GROWTH Performed at [facility], [address] [place], [place] [address]    Report Status 07/30/2018 FINAL  Final         Radiology Studies: No results  found.      Scheduled Meds: . famotidine  20 mg Oral BID  . levalbuterol  0.63 mg Nebulization BID  . loratadine  10 mg Oral Daily  . mouth rinse  15 mL Mouth Rinse BID   Continuous Infusions: . sodium chloride Stopped (07/29/18 2152)  . sodium chloride 50 mL/hr at 07/31/18 0300  . cefTRIAXone (ROCEPHIN)  IV 2 g (07/31/18 1454)     LOS: 2 days     Marcellus ScottAnand Zakyra Kukuk, MD, FACP, [facility]. [facility] Pager [phone]  If 7PM-7AM, please contact night-coverage [email or web address] Password [email or web address] 07/31/2018, 2:58 PM

## 2018-07-31 NOTE — Consult Note (Addendum)
[facility]  Total days of antibiotics 4        Day 4 ceftriaxone        Day 2 azithro       Reason for Consult: strep species bacteremia    Referring Physician: [email or web address]  Principal Problem:   Acute drug overdose Active Problems:   Acute encephalopathy   Tachycardia   Chronic back pain   ARF (acute renal failure) (HCC)    HPI: Denise Vazquez is a 42 y.o. female with past med hx of asthma, and hx of trauma related- chronic back pain, opiate dependence. Admit on 12/9 for unresponsiveness related to opiate use. On admit, found to have leukocytosis of 16K, had CXR possible infiltrate to right base per my read, and blood cx where 1 of 4 bottles isolated strep viridans group. She denied fever, chills, nightsweats, cough, only had nasal congestion. She was started on ceftriaxone and azithromycin. Patient still has considerable back pain but her congestion is improved. Denies chest pain, shortness of breath, or coughing since being admitted. Leukocytosis improved. ID asked to weigh in on abtx recs  History reviewed. No pertinent past medical history.  Allergies:  Allergies  Allergen Reactions  . Tizanidine Nausea Only   MEDICATIONS: . amLODipine  5 mg Oral Daily  . famotidine  20 mg Oral BID  . levalbuterol  0.63 mg Nebulization BID  . loratadine  10 mg Oral Daily  . mouth rinse  15 mL Mouth Rinse BID    Social History   Tobacco Use  . Smoking status: Never Smoker  . Smokeless tobacco: Never Used  Substance Use Topics  . Alcohol use: Never    Frequency: Never  . Drug use: Not on file    Family History  Problem Relation Age of Onset  . Diabetes Mellitus II Father     Review of Systems  Constitutional: Negative for fever, chills, diaphoresis, activity change, appetite change, fatigue and unexpected weight change.  HENT: Negative for congestion, sore throat, rhinorrhea, sneezing, trouble swallowing and sinus pressure.  Eyes: Negative for  photophobia and visual disturbance.  Respiratory: Negative for cough, chest tightness, shortness of breath, wheezing and stridor.  Cardiovascular: Negative for chest pain, palpitations and leg swelling.  Gastrointestinal: Negative for nausea, vomiting, abdominal pain, diarrhea, constipation, blood in stool, abdominal distention and anal bleeding.  Genitourinary: Negative for dysuria, hematuria, flank pain and difficulty urinating.  Musculoskeletal: +chronic back pain. Negative for myalgias, joint swelling, arthralgias and gait problem.  Skin: Negative for color change, pallor, rash and wound.  Neurological: Negative for dizziness, tremors, weakness and light-headedness.  Hematological: Negative for adenopathy. Does not bruise/bleed easily.  Psychiatric/Behavioral: Negative for behavioral problems, confusion, sleep disturbance, dysphoric mood, decreased concentration and agitation.     OBJECTIVE: Temp:  [97.6 F (36.4 C)-98.7 F (37.1 C)] 97.6 F (36.4 C) (12/11 1338) Pulse Rate:  [90-113] 91 (12/11 1656) Resp:  [14-18] 16 (12/11 1338) BP: (131-164)/(95-119) 141/105 (12/11 1656) SpO2:  [96 %-100 %] 98 % (12/11 1338) Physical Exam  Constitutional:  oriented to person, place, and time. appears well-developed and well-nourished. No distress.  HENT: [place]/AT, PERRLA, no scleral icterus Mouth/Throat: Oropharynx is clear and moist. No oropharyngeal exudate.  Cardiovascular: Normal rate, regular rhythm and normal heart sounds. Exam reveals no gallop and no friction rub.  No murmur heard.  Pulmonary/Chest: Effort normal and breath sounds normal. No respiratory distress.  has no wheezes.  Neck = supple, no nuchal rigidity Abdominal: Soft. Bowel  sounds are normal.  exhibits no distension. There is no tenderness.  Lymphadenopathy: no cervical adenopathy. No axillary adenopathy Neurological: alert and oriented to person, place, and time.  Skin: flushed, mild rash on shoulder (her baseline) Skin is  warm and dry. No rash noted. No erythema.  Psychiatric: a normal mood and affect.  behavior is normal.     LABS: Results for orders placed or performed during the hospital encounter of 07/28/18 (from the past 48 hour(s))  Troponin I - Now Then Q6H     Status: Abnormal   Collection Time: 07/29/18  6:02 PM  Result Value Ref Range   Troponin I 0.40 (HH) <0.03 ng/mL    Comment: CRITICAL VALUE NOTED.  VALUE IS CONSISTENT WITH PREVIOUSLY REPORTED AND CALLED VALUE. Performed at [facility], [address], [place], [place] [address]   CBC     Status: Abnormal   Collection Time: 07/30/18  2:53 AM  Result Value Ref Range   WBC 13.7 (H) 4.0 - 10.5 K/uL   RBC 3.99 3.87 - 5.11 MIL/uL   Hemoglobin 11.9 (L) 12.0 - 15.0 g/dL   HCT 56.4 33.2 - 95.1 %   MCV 93.7 80.0 - 100.0 fL   MCH 29.8 26.0 - 34.0 pg   MCHC 31.8 30.0 - 36.0 g/dL   RDW 88.4 16.6 - 06.3 %   Platelets 164 150 - 400 K/uL   nRBC 0.0 0.0 - 0.2 %    Comment: Performed at [facility], [address], [place], [place] [address]  Magnesium     Status: None   Collection Time: 07/30/18  2:53 AM  Result Value Ref Range   Magnesium 2.2 1.7 - 2.4 mg/dL    Comment: Performed at [facility], [address], [place], [place] [address]  Basic metabolic panel     Status: Abnormal   Collection Time: 07/30/18  2:53 AM  Result Value Ref Range   Sodium 140 135 - 145 mmol/L   Potassium 4.0 3.5 - 5.1 mmol/L   Chloride 110 98 - 111 mmol/L   CO2 21 (L) 22 - 32 mmol/L   Glucose, Bld 113 (H) 70 - 99 mg/dL   BUN 7 6 - 20 mg/dL   Creatinine, Ser 5.57 0.44 - 1.00 mg/dL   Calcium 7.8 (L) 8.9 - 10.3 mg/dL   GFR calc non Af Amer >60 >60 mL/min   GFR calc Af Amer >60 >60 mL/min   Anion gap 9 5 - 15    Comment: Performed at [facility], [address], [place], [place] [address]  CBC     Status: Abnormal   Collection Time: 07/31/18  5:30 AM  Result Value Ref Range   WBC  11.2 (H) 4.0 - 10.5 K/uL   RBC 3.67 (L) 3.87 - 5.11 MIL/uL   Hemoglobin 10.8 (L) 12.0 - 15.0 g/dL   HCT 54.2 (L) 70.6 - 23.7 %   MCV 90.2 80.0 - 100.0 fL   MCH 29.4 26.0 - 34.0 pg   MCHC 32.6 30.0 - 36.0 g/dL   RDW 62.8 31.5 - 17.6 %   Platelets 210 150 - 400 K/uL   nRBC 0.0 0.0 - 0.2 %    Comment: Performed at [facility], [address] [place], [place] [address]  Basic metabolic panel     Status: Abnormal   Collection Time: 07/31/18  5:30 AM  Result Value Ref Range   Sodium 143 135 -  145 mmol/L   Potassium 3.1 (L) 3.5 - 5.1 mmol/L    Comment: DELTA CHECK NOTED REPEATED TO VERIFY    Chloride 109 98 - 111 mmol/L   CO2 25 22 - 32 mmol/L   Glucose, Bld 114 (H) 70 - 99 mg/dL   BUN 7 6 - 20 mg/dL   Creatinine, Ser 1.61 0.44 - 1.00 mg/dL   Calcium 8.1 (L) 8.9 - 10.3 mg/dL   GFR calc non Af Amer >60 >60 mL/min   GFR calc Af Amer >60 >60 mL/min   Anion gap 9 5 - 15    Comment: Performed at [facility], [address] [place], [place] [address]    MICRO: 1/4 bottles strep viridans IMAGING: No results found.  HISTORICAL MICRO/IMAGING  Assessment/Plan:  42yo F admitted for unconsciousness related to opiates/muscle relaxant misuse, found to have leukocytosis, and LA 2.3 . Infectious work up revealed 1 of 4 bottles strep viridans and possible infiltrate on cxr  Given patient history prior to admit and that only 1 of 4 bottles positive - I suspect this is contaminant, and would not need treatment  possible aspiration pneumonia in the setting of unconsciousness = would finish out the last dose of ceftriaxone tomorrow.   Then watch off of abtx  Chronic back pain = defer to primary team for management
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
## 2018-08-01 DIAGNOSIS — I10 Essential (primary) hypertension: Secondary | ICD-10-CM

## 2018-08-01 LAB — CBC
HCT: 39.8 % (ref 36.0–46.0)
Hemoglobin: 13.4 g/dL (ref 12.0–15.0)
MCH: 30.4 pg (ref 26.0–34.0)
MCHC: 33.7 g/dL (ref 30.0–36.0)
MCV: 90.2 fL (ref 80.0–100.0)
Platelets: 256 10*3/uL (ref 150–400)
RBC: 4.41 MIL/uL (ref 3.87–5.11)
RDW: 12.2 % (ref 11.5–15.5)
WBC: 7.9 10*3/uL (ref 4.0–10.5)
nRBC: 0 % (ref 0.0–0.2)

## 2018-08-01 LAB — BASIC METABOLIC PANEL
Anion gap: 12 (ref 5–15)
BUN: 8 mg/dL (ref 6–20)
CO2: 22 mmol/L (ref 22–32)
Calcium: 8.9 mg/dL (ref 8.9–10.3)
Chloride: 106 mmol/L (ref 98–111)
Creatinine, Ser: 0.58 mg/dL (ref 0.44–1.00)
GFR calc Af Amer: >60 mL/min (ref >60)
GFR calc non Af Amer: >60 mL/min (ref >60)
Glucose, Bld: 99 mg/dL (ref 70–99)
Potassium: 3 mmol/L — ABNORMAL LOW (ref 3.5–5.1)
Sodium: 140 mmol/L (ref 135–145)

## 2018-08-01 LAB — CULTURE, BLOOD (ROUTINE X 2): Special Requests: ADEQUATE

## 2018-08-01 LAB — MAGNESIUM: Magnesium: 1.6 mg/dL — ABNORMAL LOW (ref 1.7–2.4)

## 2018-08-01 MED ORDER — MAGNESIUM SULFATE 2 GM/50ML IV SOLN
2.0000 g | Freq: Once | INTRAVENOUS | Status: AC
  Administered 2018-08-01: 2 g via INTRAVENOUS
  Filled 2018-08-01: qty 50

## 2018-08-01 MED ORDER — AMLODIPINE BESYLATE 10 MG PO TABS
10.0000 mg | ORAL_TABLET | Freq: Every day | ORAL | Status: DC
  Administered 2018-08-02: 10 mg via ORAL
  Filled 2018-08-01: qty 1

## 2018-08-01 MED ORDER — POTASSIUM CHLORIDE CRYS ER 20 MEQ PO TBCR
40.0000 meq | EXTENDED_RELEASE_TABLET | ORAL | Status: AC
  Administered 2018-08-01 (×2): 40 meq via ORAL
  Filled 2018-08-01 (×2): qty 2

## 2018-08-01 MED ORDER — LABETALOL HCL 5 MG/ML IV SOLN
10.0000 mg | INTRAVENOUS | Status: DC | PRN
  Administered 2018-08-01 – 2018-08-02 (×4): 10 mg via INTRAVENOUS
  Filled 2018-08-01 (×6): qty 4

## 2018-08-01 MED ORDER — AMLODIPINE BESYLATE 5 MG PO TABS
5.0000 mg | ORAL_TABLET | Freq: Once | ORAL | Status: AC
  Administered 2018-08-01: 5 mg via ORAL
  Filled 2018-08-01: qty 1

## 2018-08-01 MED ORDER — METOPROLOL TARTRATE 25 MG PO TABS
25.0000 mg | ORAL_TABLET | Freq: Two times a day (BID) | ORAL | Status: DC
  Administered 2018-08-01 – 2018-08-02 (×2): 25 mg via ORAL
  Filled 2018-08-01 (×2): qty 1

## 2018-08-01 MED ORDER — SALINE SPRAY 0.65 % NA SOLN
1.0000 | NASAL | Status: DC | PRN
  Filled 2018-08-01: qty 44

## 2018-08-01 MED ORDER — KETOROLAC TROMETHAMINE 15 MG/ML IJ SOLN
7.5000 mg | Freq: Three times a day (TID) | INTRAMUSCULAR | Status: AC | PRN
  Administered 2018-08-01 – 2018-08-02 (×3): 7.5 mg via INTRAVENOUS
  Filled 2018-08-01 (×2): qty 1

## 2018-08-01 NOTE — Discharge Instructions (Signed)

## 2018-08-01 NOTE — Care Management Note (Signed)
Case Management Note  Patient Details  Name: Jeronimo GreavesMarjorie Vasco MRN: [id number] Date of Birth: 12/10/1975  Subjective/Objective: Provided w/pain clinic resource list for [place] area, also [employer] insurance info resource. No further CM needs.                   Action/Plan:dc home.   Expected Discharge Date:  (unknown)               Expected Discharge Plan:  Home/Self Care  In-House Referral:     Discharge planning Services  CM Consult, Other - See comment(pain clinic resource)  Post Acute Care Choice:    Choice offered to:  Patient  DME Arranged:    DME Agency:     HH Arranged:    HH Agency:     Status of Service:  Completed, signed off  If discussed at [employer] Length of Stay Meetings, dates discussed:    Additional Comments:  Lanier ClamMahabir, Teige Rountree, RN 08/01/2018, 11:52 AM

## 2018-08-01 NOTE — Progress Notes (Signed)
PROGRESS NOTE   Denise Vazquez  WGN:[id number]    DOB: [birth year]    DOA: 07/28/2018  PCP: Loyal Jacobson, MD   I have briefly reviewed patients previous medical records in [facility].  Brief Narrative:  42 year old female patient with PMH of chronic pain for several years, used to see pain management 2 years ago but not since then, has been using Duragesic patch or oral narcotics from her friends, brought to the ER after she was found unresponsive in a hotel, minimal response to Narcan.  In ED, WBC 17.3, creatinine 2.3, anion gap 17, lactate 2.3, BAL negative, urine pregnancy test negative, CT head negative, chest x-ray suggested possible developing infiltrates.  She was admitted for suspected acute encephalopathy possibly from some substance OD.  Acute encephalopathy resolved.  Ongoing chronic pain issues.  Elevated blood pressures.  Blood cultures x1 shows strep species, final results pending.  ID consulted.   Assessment & Plan:   Principal Problem:   Acute drug overdose Active Problems:   Acute encephalopathy   Tachycardia   Chronic back pain   ARF (acute renal failure) (HCC)   Acute encephalopathy: Unclear etiology but suspected due to unknown drug overdose.  She reported taking oxycodone 20 mg x 2 tablets for pain.  Received Narcan with minimal response.  CT head and EEG negative.  Counseled extensively regarding avoiding prescription or nonprescription medication abuse and she verbalized understanding.  Resolved.  Interestingly UDS only positive for benzodiazepines.  Suspected aspiration pneumonia: Probably during episode of unresponsiveness.  Currently on IV ceftriaxone and complete total 5 days course today.  Improved.  Follow chest x-ray in 4 weeks to ensure.  Strep viridans bacteremia with sepsis: Unclear source.?  Dental caries.  1 of 2 blood cultures positive for strep viridans.  As per ID input, suspect 1 of 4 blood cultures positive was a contaminant and no further  treatment recommended for this apart from finishing up IV ceftriaxone for suspected aspiration pneumonia.  Dental caries: Recommended outpatient dental consultation and she verbalized understanding.  Acute kidney injury: Suspected due to hypovolemia.  Resolved.  Monitor BMP closely.  Normocytic anemia: May have been dilutional.  Hemoglobin normal today.  Leukocytosis: Secondary to infectious etiology as noted above.  Resolved.  Hypokalemia: Replace aggressively and follow  Hypomagnesemia: Replace and follow  Elevated troponin and d-dimer: No report of chest pain.  TTE 07/29/2018: LVEF 60-65%, normal wall motion and no regional wall motion abnormalities, grade 1 diastolic dysfunction.  VQ scan negative for PE.  She was briefly on IV heparin, now discontinued.  Elevated troponin likely due to demand ischemia from acute kidney injury and aspiration pneumonia.  Asthma exacerbation: Continue nebulizations.  Solu-Medrol discontinued.  Resolved.  Chronic pain: Will need to follow-up with pain management as outpatient.  Pain not adequately controlled on as needed OxyIR.  Added brief IV Toradol with good effect.  Monitor BMP closely.  Elevated blood pressure/possible hypertension: No prior history of hypertension or taking medications for same.  Could be precipitated by pain but seems to be elevated even after pain better controlled.  BP not adequately controlled with amlodipine 5 mg daily.  Added metoprolol 25 mg twice daily.  Continue PRN IV hydralazine.  Suspected drug overdose: Social work consulted for outpatient resources.  No features of overt withdrawal.  COWS score low. Psychiatry consultation 12/9 appreciated who discussed Suboxone treatment with patient for opioid abuse and chronic pain and recommended social work provide information for Suboxone clinics as well as substance abuse  treatment resources.  Clonidine protocol recommended if experiencing opioid withdrawal, none seen at this time.   No suicidal or homicidal ideations noted. CSW has referred patient to [facility].  Sinus tachycardia: Likely due to infectious etiology.  Improved.  Headache: No prior history of migraine headaches.  CT head without acute findings.  Reports sinus congestion but no sinusitis by CT.  Nasal saline drops.  PRN NSAIDs.    DVT prophylaxis: SCDs Code Status: Full Family Communication: None at bedside Disposition: To be determined pending clinical improvement and adequate BP control.   Consultants:  Infectious disease  Procedures:  None  Antimicrobials:  IV ceftriaxone   Subjective: Intermittent headaches.  No visual symptoms, nausea or vomiting.  No toothache.  No chest pain or dyspnea reported.  Chronic low back pain and right foot pain.  ROS: As above, otherwise negative.  Objective:  Vitals:   08/01/18 0433 08/01/18 1343 08/01/18 1522 08/01/18 1647  BP: 133/86 (!) 147/102 (!) 160/110 (!) 168/116  Pulse: 85 (!) 107  99  Resp: 19 18    Temp: 98.1 F (36.7 C) 98 F (36.7 C)    TempSrc:  Oral    SpO2: 98% 99%  99%  Weight:      Height:        Examination:  General exam: Pleasant young female, moderately built and nourished lying comfortably propped up in bed without distress. Respiratory system: Clear to auscultation. Respiratory effort normal.  Stable Cardiovascular system: S1 & S2 heard, RRR. No JVD, murmurs, rubs, gallops or clicks. No pedal edema.  Stable Gastrointestinal system: Abdomen is nondistended, soft and nontender. No organomegaly or masses felt. Normal bowel sounds heard.  Stable Central nervous system: Alert and oriented. No focal neurological deficits.  Stable without change Extremities: Symmetric 5 x 5 power. Skin: No rashes, lesions or ulcers Psychiatry: Judgement and insight appear normal. Mood & affect appropriate.  She did not appear anxious or tremulous. ENT: Right upper second molar almost completely destroyed by caries and may be right upper third molar  also involved.    Data Reviewed: I have personally reviewed following labs and imaging studies  CBC: Recent Labs  Lab 07/28/18 2053 07/29/18 0659 07/30/18 0253 07/31/18 0530 08/01/18 0533  WBC 17.3* 16.9* 13.7* 11.2* 7.9  NEUTROABS 15.1*  --   --   --   --   HGB 15.8* 11.6* 11.9* 10.8* 13.4  HCT 51.2* 35.6* 37.4 33.1* 39.8  MCV 96.2 94.9 93.7 90.2 90.2  PLT 378 208 164 210 256   Basic Metabolic Panel: Recent Labs  Lab 07/28/18 2053 07/29/18 0659 07/29/18 0843 07/30/18 0253 07/31/18 0530 08/01/18 0533 08/01/18 0543  NA 144 141  --  140 143 140  --   K 3.7 3.8  --  4.0 3.1* 3.0*  --   CL 104 112*  --  110 109 106  --   CO2 23 20*  --  21* 25 22  --   GLUCOSE 80 138*  --  113* 114* 99  --   BUN 19 21*  --  7 7 8   --   CREATININE 2.30* 1.25*  --  0.62 0.54 0.58  --   CALCIUM 8.9 7.3*  --  7.8* 8.1* 8.9  --   MG  --   --  1.6* 2.2  --   --  1.6*   Liver Function Tests: Recent Labs  Lab 07/28/18 2053 07/29/18 0659  AST 44* 43*  ALT 24 22  ALKPHOS 80  35*  BILITOT 0.4 0.4  PROT 7.4 5.3*  ALBUMIN 4.4 3.1*   Coagulation Profile: Recent Labs  Lab 07/29/18 0843  INR 1.15   Cardiac Enzymes: Recent Labs  Lab 07/28/18 2053 07/29/18 0103 07/29/18 0659 07/29/18 1243 07/29/18 1802  CKTOTAL 60  --   --   --   --   TROPONINI  --  0.88* 0.75* 0.54* 0.40*     Recent Results (from the past 240 hour(s))  Culture, blood (routine x 2) Call MD if unable to obtain prior to antibiotics being given     Status: Abnormal   Collection Time: 07/29/18  1:14 AM  Result Value Ref Range Status   Specimen Description   Final    BLOOD RIGHT ANTECUBITAL Performed at [facility], [address], [place], [place] [address]    Special Requests   Final    BOTTLES DRAWN AEROBIC AND ANAEROBIC Blood Culture adequate volume Performed at [facility], [address], [place], [place] [address]    Culture  Setup Time   Final    GRAM POSITIVE  COCCI IN CHAINS IN BOTH AEROBIC AND ANAEROBIC BOTTLES CRITICAL RESULT CALLED TO, READ BACK BY AND VERIFIED WITH: A. PHAM, PHARMD AT 0930 ON 07/30/18 BY C. JESSUP, MLT.    Culture (A)  Final    VIRIDANS STREPTOCOCCUS THE SIGNIFICANCE OF ISOLATING THIS ORGANISM FROM A SINGLE SET OF BLOOD CULTURES WHEN MULTIPLE SETS ARE DRAWN IS UNCERTAIN. PLEASE NOTIFY THE MICROBIOLOGY DEPARTMENT WITHIN ONE WEEK IF SPECIATION AND SENSITIVITIES ARE REQUIRED. Performed at [facility], [address] [place], [place] [address]    Report Status 08/01/2018 FINAL  Final  Blood Culture ID Panel (Reflexed)     Status: Abnormal   Collection Time: 07/29/18  1:14 AM  Result Value Ref Range Status   Enterococcus species NOT DETECTED NOT DETECTED Final   Listeria monocytogenes NOT DETECTED NOT DETECTED Final   Staphylococcus species NOT DETECTED NOT DETECTED Final   Staphylococcus aureus (BCID) NOT DETECTED NOT DETECTED Final   Streptococcus species DETECTED (A) NOT DETECTED Final    Comment: Not Enterococcus species, Streptococcus agalactiae, Streptococcus pyogenes, or Streptococcus pneumoniae. CRITICAL RESULT CALLED TO, READ BACK BY AND VERIFIED WITH: A. PHAM, PHARMD AT 0930 ON 07/30/18 BY C. JESSUP, MLT.    Streptococcus agalactiae NOT DETECTED NOT DETECTED Final   Streptococcus pneumoniae NOT DETECTED NOT DETECTED Final   Streptococcus pyogenes NOT DETECTED NOT DETECTED Final   Acinetobacter baumannii NOT DETECTED NOT DETECTED Final   Enterobacteriaceae species NOT DETECTED NOT DETECTED Final   Enterobacter cloacae complex NOT DETECTED NOT DETECTED Final   Escherichia coli NOT DETECTED NOT DETECTED Final   Klebsiella oxytoca NOT DETECTED NOT DETECTED Final   Klebsiella pneumoniae NOT DETECTED NOT DETECTED Final   Proteus species NOT DETECTED NOT DETECTED Final   Serratia marcescens NOT DETECTED NOT DETECTED Final   Haemophilus influenzae NOT DETECTED NOT DETECTED Final   Neisseria meningitidis NOT  DETECTED NOT DETECTED Final   Pseudomonas aeruginosa NOT DETECTED NOT DETECTED Final   Candida albicans NOT DETECTED NOT DETECTED Final   Candida glabrata NOT DETECTED NOT DETECTED Final   Candida krusei NOT DETECTED NOT DETECTED Final   Candida parapsilosis NOT DETECTED NOT DETECTED Final   Candida tropicalis NOT DETECTED NOT DETECTED Final    Comment: Performed at [facility], [address], [place], [place] [address]  Culture, blood (routine x 2) Call MD if unable to obtain prior  to antibiotics being given     Status: None (Preliminary result)   Collection Time: 07/29/18  5:03 AM  Result Value Ref Range Status   Specimen Description   Final    BLOOD LEFT HAND Performed at [facility], [address] [place], [place] [address]    Special Requests   Final    BOTTLES DRAWN AEROBIC AND ANAEROBIC Blood Culture results may not be optimal due to an excessive volume of blood received in culture bottles Performed at [facility], [address] [place], [place] [address]    Culture   Final    NO GROWTH 3 DAYS Performed at [facility], [address] [place], [place] [address]    Report Status PENDING  Incomplete  Culture, sputum-assessment     Status: None   Collection Time: 07/29/18  5:25 AM  Result Value Ref Range Status   Specimen Description SPUTUM  Final   Special Requests NONE  Final   Sputum evaluation   Final    Sputum specimen not acceptable for testing.  Please recollect.   JODY ED ON [id number] @0558  BY MCCOY,N Performed at [facility], [address] [place], [place] [address]    Report Status 07/29/2018 FINAL  Final  MRSA PCR Screening     Status: None   Collection Time: 07/29/18  6:59 AM  Result Value Ref Range Status   MRSA by PCR NEGATIVE NEGATIVE Final    Comment:        The GeneXpert MRSA Assay (FDA approved for NASAL specimens only), is one component of a comprehensive MRSA  colonization surveillance program. It is not intended to diagnose MRSA infection nor to guide or monitor treatment for MRSA infections. Performed at [facility], [address] [place], [place] [address]   Urine Culture     Status: None   Collection Time: 07/29/18  9:03 AM  Result Value Ref Range Status   Specimen Description   Final    URINE, RANDOM Performed at [facility], [address] [place], [place] [address]    Special Requests   Final    NONE Performed at [facility], [address] [place], [place] [address]    Culture   Final    NO GROWTH Performed at [facility] [place] [facility], [address] [place], [place] [address]    Report Status 07/30/2018 FINAL  Final         Radiology Studies: No results found.      Scheduled Meds: . amLODipine  5 mg Oral Daily  . famotidine  20 mg Oral BID  . levalbuterol  0.63 mg Nebulization BID  . loratadine  10 mg Oral Daily  . mouth rinse  15 mL Mouth Rinse BID   Continuous Infusions: . sodium chloride Stopped (07/29/18 2152)  . cefTRIAXone (ROCEPHIN)  IV 2 g (08/01/18 1335)     LOS: 3 days     Marcellus ScottAnand Haset [place], MD, FACP, [facility]. [facility] Pager [phone]  If 7PM-7AM, please contact night-coverage [email or web address] Password [email or web address] 08/01/2018, 6:03 PM

## 2018-08-01 NOTE — Progress Notes (Signed)
Clinical Social Worker following patient for support and discharge needs. CSW received consult to assist patient for pain management and resources for suboxone clinic. CSW gave patient information on [facility] and stated to patient she would need to follow up with Romilda Joy a provider at facility. CSW stated to patient that she would be able to walk and get assessed but stated patient might not received suboxone right away. CSW gave patient information and time she could got to the clinic. Patient was given pain clinic resources by [facility]. CSW signing off as patients need have been met.   Rhea Pink, MSW,  [place]

## 2018-08-02 DIAGNOSIS — I10 Essential (primary) hypertension: Secondary | ICD-10-CM

## 2018-08-02 DIAGNOSIS — M5441 Lumbago with sciatica, right side: Secondary | ICD-10-CM

## 2018-08-02 DIAGNOSIS — G8929 Other chronic pain: Secondary | ICD-10-CM

## 2018-08-02 LAB — BASIC METABOLIC PANEL
Anion gap: 10 (ref 5–15)
BUN: 13 mg/dL (ref 6–20)
CO2: 23 mmol/L (ref 22–32)
Calcium: 9 mg/dL (ref 8.9–10.3)
Chloride: 106 mmol/L (ref 98–111)
Creatinine, Ser: 0.74 mg/dL (ref 0.44–1.00)
GFR calc Af Amer: >60 mL/min (ref >60)
GFR calc non Af Amer: >60 mL/min (ref >60)
Glucose, Bld: 119 mg/dL — ABNORMAL HIGH (ref 70–99)
Potassium: 4 mmol/L (ref 3.5–5.1)
SODIUM: 139 mmol/L (ref 135–145)

## 2018-08-02 LAB — MAGNESIUM: MAGNESIUM: 2.2 mg/dL (ref 1.7–2.4)

## 2018-08-02 MED ORDER — SALINE SPRAY 0.65 % NA SOLN: 1.0000 | NASAL | 0 refills | Status: DC | PRN

## 2018-08-02 MED ORDER — IBUPROFEN 200 MG PO TABS: 600.0000 mg | ORAL_TABLET | Freq: Four times a day (QID) | ORAL | Status: DC | PRN

## 2018-08-02 MED ORDER — METOPROLOL TARTRATE 25 MG PO TABS: 25.0000 mg | ORAL_TABLET | Freq: Two times a day (BID) | ORAL | 0 refills | Status: DC

## 2018-08-02 MED ORDER — AMLODIPINE BESYLATE 10 MG PO TABS: 10.0000 mg | ORAL_TABLET | Freq: Every day | ORAL | 0 refills | Status: DC

## 2018-08-02 NOTE — Discharge Summary (Signed)
Physician Discharge Summary  Sagrario Lineberry ZOX:[id number] DOB: Dec 21, 1975  PCP: Loyal Jacobson, MD  Admit date: 07/28/2018 Discharge date: 08/02/2018  Recommendations for Outpatient Follow-up:  1. Dr. Loyal Jacobson, PCP in 5 days with repeat labs (CBC, BMP & magnesium). 2. Recommend repeating chest x-ray in 4 weeks to insure resolution of pneumonia findings. 3. Clinical social work has provided patient with resources for pain management and Suboxone clinic.  She has been given information on [facility] (alcohol and drug services).  Home Health: None Equipment/Devices: None  Discharge Condition: Improved and stable CODE STATUS: Full Diet recommendation: Heart healthy diet.  Discharge Diagnoses:  Principal Problem:   Acute drug overdose Active Problems:   Acute encephalopathy   Tachycardia   Chronic back pain   ARF (acute renal failure) (HCC)   Brief Summary: 42 year old female patient with PMH of chronic pain for several years, used to see pain management 2 years ago but not since then, has been using Duragesic patch or oral narcotics from her friends, brought to the ER after she was found unresponsive in a hotel, minimal response to Narcan.  By the time patient reached ER she became more alert and awake but she reported not recalling events leading to hospital arrival. In ED, WBC 17.3, creatinine 2.3, anion gap 17, lactate 2.3, BAL negative, urine pregnancy test negative, CT head negative, chest x-ray suggested possible developing infiltrates and EKG showed sinus tachycardia with probable biatrial enlargement and LVH.  She was admitted for suspected acute encephalopathy possibly from some substance OD.   Assessment & Plan:  Acute encephalopathy: Unclear etiology but suspected due to unknown drug overdose.  She reported taking oxycodone 20 mg x 2 tablets for pain.  Received Narcan with minimal response.  CT head and EEG negative.  Counseled extensively regarding avoiding prescription  or nonprescription medication abuse and she verbalized understanding.  Resolved.  Interestingly UDS only positive for benzodiazepines.  Suspected aspiration pneumonia: Probably during episode of unresponsiveness.    She was treated with IV ceftriaxone and as per ID recommendations, completed 5 days course and no antibiotics at discharge.  Follow chest x-ray in 4 weeks to insure resolution of pneumonia findings  1 of 4 blood culture bottles positive for strep viridans:  As per ID input, suspect 1 of 4 blood cultures positive was a contaminant and no further treatment recommended for this apart from finishing up IV ceftriaxone for suspected aspiration pneumonia.  Dental caries: Recommended outpatient dental consultation and she verbalized understanding.  Acute kidney injury: Suspected due to hypovolemia.  Resolved.   Normocytic anemia: May have been dilutional.  Hemoglobin normal.  Leukocytosis: Secondary to infectious etiology as noted above.  Resolved.  Hypokalemia: Replaced  Hypomagnesemia: Replaced  Elevated troponin and d-dimer: Had some mild atypical chest discomfort reported in ED but none since.  Troponin with decreasing trend.   Admission EKG showed sinus tachycardia without acute ischemic changes.  TTE 07/29/2018: LVEF 60-65%, normal wall motion and no regional wall motion abnormalities, grade 1 diastolic dysfunction.  VQ scan negative for PE.  She was briefly on IV heparin, now discontinued.  Elevated troponin likely due to demand ischemia from acute kidney injury and aspiration pneumonia.  Asthma exacerbation: Resolved.  Chronic pain: Will need to follow-up with pain management as outpatient.  Had some relief with NSAIDs.  Elevated blood pressure/possible hypertension: No prior history of hypertension or taking medications for same.  Could be precipitated by pain but seems to be elevated even after pain better controlled.  Started  her on amlodipine 10 mg daily and  metoprolol 25 mg twice daily.  Some of her elevated blood pressure was also driven by anxiety related to hospitalization and she herself voiced that when she goes home, she suspects it will improve.  Blood pressure control not optimal but improved and can be closely followed up as outpatient.  Low-salt diet advised.  She gives history of uterine ablation 7 to 8 years ago and states that her husband also has had vasectomy done.  They have 3 kids.  Suspected drug overdose: Social work consulted for outpatient resources.  No features of overt withdrawal.  COWS score low. Psychiatry consultation 12/9 appreciated who discussed Suboxone treatment with patient for opioid abuse and chronic pain and social work provided information for Suboxone clinics as well as substance abuse treatment resources.  No suicidal or homicidal ideations noted. CSW has referred patient to [facility].  Sinus tachycardia: Likely due to infectious etiology.  Improved.  Headache: No prior history of migraine headaches.  CT head without acute findings.  Reports sinus congestion but no sinusitis by CT.  Nasal saline drops. Could also be due to elevated blood pressure.  PRN NSAIDs.  Improved.   Consultants:  Psychiatry Infectious disease  Procedures:  TTE 07/29/2018: Study Conclusions  - Left ventricle: The cavity size was normal. Systolic function was   normal. The estimated ejection fraction was in the range of 60%   to 65%. Wall motion was normal; there were no regional wall   motion abnormalities. Doppler parameters are consistent with   abnormal left ventricular relaxation (grade 1 diastolic   dysfunction). - Aortic valve: Transvalvular velocity was within the normal range.   There was no stenosis. There was no regurgitation. Valve area   (VTI): 2.66 cm^2. Valve area (Vmax): 2.92 cm^2. Valve area   (Vmean): 2.53 cm^2. - Mitral valve: Transvalvular velocity was within the normal range.   There was no evidence for  stenosis. There was trivial   regurgitation. - Right ventricle: The cavity size was normal. Wall thickness was   normal. Systolic function was normal. - Atrial septum: No defect or patent foramen ovale was identified   by color flow Doppler. - Pulmonary arteries: Systolic pressure was within the normal   range. - Global longitudinal strain -17.7% (normal).  EEG 07/29/2018: IMPRESSION: Normal electroencephalogram, awake and drowsy. There are no focal lateralizing or epileptiform features.   Discharge Instructions  Discharge Instructions    Call MD for:  difficulty breathing, headache or visual disturbances   Complete by:  As directed    Call MD for:  difficulty breathing, headache or visual disturbances   Complete by:  As directed    Call MD for:  extreme fatigue   Complete by:  As directed    Call MD for:  extreme fatigue   Complete by:  As directed    Call MD for:  persistant dizziness or light-headedness   Complete by:  As directed    Call MD for:  persistant dizziness or light-headedness   Complete by:  As directed    Call MD for:  persistant nausea and vomiting   Complete by:  As directed    Call MD for:  persistant nausea and vomiting   Complete by:  As directed    Call MD for:  severe uncontrolled pain   Complete by:  As directed    Call MD for:  severe uncontrolled pain   Complete by:  As directed    Call MD  for:  temperature >100.4   Complete by:  As directed    Call MD for:  temperature >100.4   Complete by:  As directed    Diet - low sodium heart healthy   Complete by:  As directed    Diet - low sodium heart healthy   Complete by:  As directed    Increase activity slowly   Complete by:  As directed    Increase activity slowly   Complete by:  As directed        Medication List    TAKE these medications   albuterol 108 (90 Base) MCG/ACT inhaler Commonly known as:  PROVENTIL HFA;VENTOLIN HFA Inhale 2 puffs into the lungs every 4 (four) hours as needed  for wheezing or shortness of breath.   amLODipine 10 MG tablet Commonly known as:  NORVASC Take 1 tablet (10 mg total) by mouth daily. Start taking on:  August 03, 2018   carisoprodol 350 MG tablet Commonly known as:  SOMA Take 350 mg by mouth 2 (two) times daily as needed for muscle spasms.   ibuprofen 200 MG tablet Commonly known as:  ADVIL,MOTRIN Take 3 tablets (600 mg total) by mouth every 6 (six) hours as needed for headache, mild pain or moderate pain.   metoprolol tartrate 25 MG tablet Commonly known as:  LOPRESSOR Take 1 tablet (25 mg total) by mouth 2 (two) times daily.   sodium chloride 0.65 % Soln nasal spray Commonly known as:  OCEAN Place 1 spray into both nostrils as needed for congestion.   zolpidem 10 MG tablet Commonly known as:  AMBIEN Take 10 mg by mouth at bedtime as needed for sleep.      Follow-up Information    Loyal Jacobson, MD. Schedule an appointment as soon as possible for a visit in 5 day(s).   Specialty:  Family Medicine Why:  To be seen with repeat labs (CBC, BMP & magnesium).  Recommend repeating chest x-ray in 4 weeks to insure resolution of pneumonia findings. Contact information: [address] [place] [address] [phone]          Allergies  Allergen Reactions  . Tizanidine Nausea Only      Procedures/Studies: Ct Head Wo Contrast  Result Date: 07/28/2018 CLINICAL DATA:  42 year old female with altered mental status. EXAM: CT HEAD WITHOUT CONTRAST TECHNIQUE: Contiguous axial images were obtained from the base of the skull through the vertex without intravenous contrast. COMPARISON:  None. FINDINGS: Brain: The ventricles and sulci appropriate size for patient's age. The gray-white matter discrimination is preserved. There is no acute intracranial hemorrhage. No mass effect or midline shift. No extra-axial fluid collection. Vascular: No hyperdense vessel or unexpected calcification. Skull: Normal. Negative for  fracture or focal lesion. Sinuses/Orbits: Bilateral maxillary sinus retention cyst or polyp. The mastoid air cells are clear. Other: None IMPRESSION: No acute intracranial pathology. Electronically Signed   By: Elgie Collard M.D.   On: 07/28/2018 23:42   Nm Pulmonary Perf And Vent  Result Date: 07/29/2018 CLINICAL DATA:  Chest pain and back pain and shortness of breath for 1 day. EXAM: NUCLEAR MEDICINE VENTILATION - PERFUSION LUNG SCAN TECHNIQUE: Ventilation images were obtained in multiple projections using inhaled aerosol Tc-20m DTPA. Perfusion images were obtained in multiple projections after intravenous injection of Tc-70m MAA. RADIOPHARMACEUTICALS:  25.0 mCi of Tc-32m DTPA aerosol inhalation and 3.8 mCi Tc44m MAA IV COMPARISON:  Chest radiograph, 07/28/2018. FINDINGS: Ventilation: Nonsegmental areas of relative decreased ventilation most evident in  right mid to lower lung, which is linear and configuration. Perfusion: There are no segmental perfusion defects. There are mild areas of relative decreased perfusion which correspond to more prominent areas of decreased ventilation. IMPRESSION: 1. There are no segmental perfusion defects to suggest a pulmonary thromboembolism. Mild nonsegmental areas of relative decreased perfusion with more prominent corresponding areas of decreased ventilation. Findings suggest small airways disease. Electronically Signed   By: Amie Portlandavid  Ormond M.D.   On: 07/29/2018 13:13   Dg Chest Portable 1 View  Result Date: 07/28/2018 CLINICAL DATA:  42 year old female with hypoxia. EXAM: PORTABLE CHEST 1 VIEW COMPARISON:  None. FINDINGS: Minimal increased density is over the left lower lung field, likely atelectatic changes. Developing infiltrate is less likely but not excluded. Clinical correlation is recommended. No focal consolidation, pleural effusion, or pneumothorax. The cardiac silhouette is within normal limits. No acute osseous pathology. IMPRESSION: Minimal left lung base  atelectasis, less likely developing infiltrate. Electronically Signed   By: Elgie CollardArash  Radparvar M.D.   On: 07/28/2018 21:31      Subjective: Patient anxious to go home.  No headache or pain elsewhere reported.  No cough, dyspnea or chest pain.  As per RN, no acute issues noted.  Discharge Exam:  Vitals:   08/02/18 0750 08/02/18 0818 08/02/18 0921 08/02/18 1019  BP: (!) 161/112 (!) 143/97 (!) 157/113 (!) 154/109  Pulse: 91 98 (!) 102 90  Resp: 16 16  16   Temp: 98.4 F (36.9 C) 98 F (36.7 C)  98 F (36.7 C)  TempSrc: Oral Oral  Oral  SpO2: 100% 99%  98%  Weight:      Height:        General exam: Pleasant young female, moderately built and nourished lying comfortably propped up in bed without distress. Respiratory system: Clear to auscultation. Respiratory effort normal.   Cardiovascular system: S1 & S2 heard, RRR. No JVD, murmurs, rubs, gallops or clicks. No pedal edema. Gastrointestinal system: Abdomen is nondistended, soft and nontender. No organomegaly or masses felt. Normal bowel sounds heard.   Central nervous system: Alert and oriented. No focal neurological deficits. Extremities: Symmetric 5 x 5 power. Skin: No rashes, lesions or ulcers Psychiatry: Judgement and insight appear normal. Mood & affect appropriate.  She did not appear anxious or tremulous. ENT: Right upper second molar almost completely destroyed by caries and may be right upper third molar also involved.    The results of significant diagnostics from this hospitalization (including imaging, microbiology, ancillary and laboratory) are listed below for reference.     Microbiology: Recent Results (from the past 240 hour(s))  Culture, blood (routine x 2) Call MD if unable to obtain prior to antibiotics being given     Status: Abnormal   Collection Time: 07/29/18  1:14 AM  Result Value Ref Range Status   Specimen Description   Final    BLOOD RIGHT ANTECUBITAL Performed at [facility], [address] [place], [place] [address]    Special Requests   Final    BOTTLES DRAWN AEROBIC AND ANAEROBIC Blood Culture adequate volume Performed at [facility], [address] [place], [place] [address]    Culture  Setup Time   Final    GRAM POSITIVE COCCI IN CHAINS IN BOTH AEROBIC AND ANAEROBIC BOTTLES CRITICAL RESULT CALLED TO, READ BACK BY AND VERIFIED WITH: A. PHAM, PHARMD AT 0930 ON 07/30/18 BY C. JESSUP, MLT.    Culture (A)  Final    VIRIDANS STREPTOCOCCUS THE  SIGNIFICANCE OF ISOLATING THIS ORGANISM FROM A SINGLE SET OF BLOOD CULTURES WHEN MULTIPLE SETS ARE DRAWN IS UNCERTAIN. PLEASE NOTIFY THE MICROBIOLOGY DEPARTMENT WITHIN ONE WEEK IF SPECIATION AND SENSITIVITIES ARE REQUIRED. Performed at [facility], [address] [place], [place] [address]    Report Status 08/01/2018 FINAL  Final  Blood Culture ID Panel (Reflexed)     Status: Abnormal   Collection Time: 07/29/18  1:14 AM  Result Value Ref Range Status   Enterococcus species NOT DETECTED NOT DETECTED Final   Listeria monocytogenes NOT DETECTED NOT DETECTED Final   Staphylococcus species NOT DETECTED NOT DETECTED Final   Staphylococcus aureus (BCID) NOT DETECTED NOT DETECTED Final   Streptococcus species DETECTED (A) NOT DETECTED Final    Comment: Not Enterococcus species, Streptococcus agalactiae, Streptococcus pyogenes, or Streptococcus pneumoniae. CRITICAL RESULT CALLED TO, READ BACK BY AND VERIFIED WITH: A. PHAM, PHARMD AT 0930 ON 07/30/18 BY C. JESSUP, MLT.    Streptococcus agalactiae NOT DETECTED NOT DETECTED Final   Streptococcus pneumoniae NOT DETECTED NOT DETECTED Final   Streptococcus pyogenes NOT DETECTED NOT DETECTED Final   Acinetobacter baumannii NOT DETECTED NOT DETECTED Final   Enterobacteriaceae species NOT DETECTED NOT DETECTED Final   Enterobacter cloacae complex NOT DETECTED NOT DETECTED Final   Escherichia coli NOT DETECTED NOT DETECTED Final   Klebsiella oxytoca NOT  DETECTED NOT DETECTED Final   Klebsiella pneumoniae NOT DETECTED NOT DETECTED Final   Proteus species NOT DETECTED NOT DETECTED Final   Serratia marcescens NOT DETECTED NOT DETECTED Final   Haemophilus influenzae NOT DETECTED NOT DETECTED Final   Neisseria meningitidis NOT DETECTED NOT DETECTED Final   Pseudomonas aeruginosa NOT DETECTED NOT DETECTED Final   Candida albicans NOT DETECTED NOT DETECTED Final   Candida glabrata NOT DETECTED NOT DETECTED Final   Candida krusei NOT DETECTED NOT DETECTED Final   Candida parapsilosis NOT DETECTED NOT DETECTED Final   Candida tropicalis NOT DETECTED NOT DETECTED Final    Comment: Performed at [facility], [address], [place], [place] [address]  Culture, blood (routine x 2) Call MD if unable to obtain prior to antibiotics being given     Status: None (Preliminary result)   Collection Time: 07/29/18  5:03 AM  Result Value Ref Range Status   Specimen Description   Final    BLOOD LEFT HAND Performed at [facility], [address] [place], [place] [address]    Special Requests   Final    BOTTLES DRAWN AEROBIC AND ANAEROBIC Blood Culture results may not be optimal due to an excessive volume of blood received in culture bottles Performed at [facility], [address], [place], [place] [address]    Culture   Final    NO GROWTH 4 DAYS Performed at [facility], [address], [place], [place] [address]    Report Status PENDING  Incomplete  Culture, sputum-assessment     Status: None   Collection Time: 07/29/18  5:25 AM  Result Value Ref Range Status   Specimen Description SPUTUM  Final   Special Requests NONE  Final   Sputum evaluation   Final    Sputum specimen not acceptable for testing.  Please recollect.   JODY ED ON [id number] @0558  BY MCCOY,N Performed at [facility], [address] [place], [place] [address]    Report Status 07/29/2018 FINAL  Final  MRSA PCR  Screening     Status: None  Collection Time: 07/29/18  6:59 AM  Result Value Ref Range Status   MRSA by PCR NEGATIVE NEGATIVE Final    Comment:        The GeneXpert MRSA Assay (FDA approved for NASAL specimens only), is one component of a comprehensive MRSA colonization surveillance program. It is not intended to diagnose MRSA infection nor to guide or monitor treatment for MRSA infections. Performed at [facility], [address] [place], [place] [address]   Urine Culture     Status: None   Collection Time: 07/29/18  9:03 AM  Result Value Ref Range Status   Specimen Description   Final    URINE, RANDOM Performed at [facility], [address] [place], [place] [address]    Special Requests   Final    NONE Performed at [facility], [address] [place], [place] [address]    Culture   Final    NO GROWTH Performed at [facility], [address], [place], [place] [address]    Report Status 07/30/2018 FINAL  Final     Labs: CBC: Recent Labs  Lab 07/28/18 2053 07/29/18 0659 07/30/18 0253 07/31/18 0530 08/01/18 0533  WBC 17.3* 16.9* 13.7* 11.2* 7.9  NEUTROABS 15.1*  --   --   --   --   HGB 15.8* 11.6* 11.9* 10.8* 13.4  HCT 51.2* 35.6* 37.4 33.1* 39.8  MCV 96.2 94.9 93.7 90.2 90.2  PLT 378 208 164 210 256   Basic Metabolic Panel: Recent Labs  Lab 07/29/18 0659 07/29/18 0843 07/30/18 0253 07/31/18 0530 08/01/18 0533 08/01/18 0543 08/02/18 0538  NA 141  --  140 143 140  --  139  K 3.8  --  4.0 3.1* 3.0*  --  4.0  CL 112*  --  110 109 106  --  106  CO2 20*  --  21* 25 22  --  23  GLUCOSE 138*  --  113* 114* 99  --  119*  BUN 21*  --  7 7 8   --  13  CREATININE 1.25*  --  0.62 0.54 0.58  --  0.74  CALCIUM 7.3*  --  7.8* 8.1* 8.9  --  9.0  MG  --  1.6* 2.2  --   --  1.6* 2.2   Liver Function Tests: Recent Labs  Lab 07/28/18 2053 07/29/18 0659  AST 44* 43*  ALT 24 22  ALKPHOS 80 35*   BILITOT 0.4 0.4  PROT 7.4 5.3*  ALBUMIN 4.4 3.1*   Cardiac Enzymes: Recent Labs  Lab 07/28/18 2053 07/29/18 0103 07/29/18 0659 07/29/18 1243 07/29/18 1802  CKTOTAL 60  --   --   --   --   TROPONINI  --  0.88* 0.75* 0.54* 0.40*   Urinalysis    Component Value Date/Time   COLORURINE YELLOW 07/29/2018 0103   APPEARANCEUR CLOUDY (A) 07/29/2018 0103   LABSPEC 1.014 07/29/2018 0103   PHURINE 5.0 07/29/2018 0103   GLUCOSEU NEGATIVE 07/29/2018 0103   HGBUR SMALL (A) 07/29/2018 0103   BILIRUBINUR NEGATIVE 07/29/2018 0103   KETONESUR NEGATIVE 07/29/2018 0103   PROTEINUR NEGATIVE 07/29/2018 0103   UROBILINOGEN 0.2 05/05/2010 0942   NITRITE NEGATIVE 07/29/2018 0103   LEUKOCYTESUR NEGATIVE 07/29/2018 0103      Time coordinating discharge: 40 minutes  SIGNED:  Marcellus Scott, MD, FACP, [facility]. [facility] Pager [phone]  If 7PM-7AM, please contact night-coverage [email or web address] Password [email or web address] 08/02/2018, 12:30 PM

## 2018-08-03 LAB — CULTURE, BLOOD (ROUTINE X 2): Culture: NO GROWTH

## 2019-04-30 DIAGNOSIS — E669 Obesity, unspecified: Secondary | ICD-10-CM | POA: Insufficient documentation

## 2019-04-30 DIAGNOSIS — E66811 Obesity, class 1: Secondary | ICD-10-CM | POA: Insufficient documentation

## 2020-08-28 IMAGING — DX DG CHEST 1V PORT
1 series · 1 of 1 positions shown · non-contrast
Comparison: None.

CLINICAL DATA: 42-year-old female with hypoxia.

EXAM:
PORTABLE CHEST 1 VIEW

[chest ap]
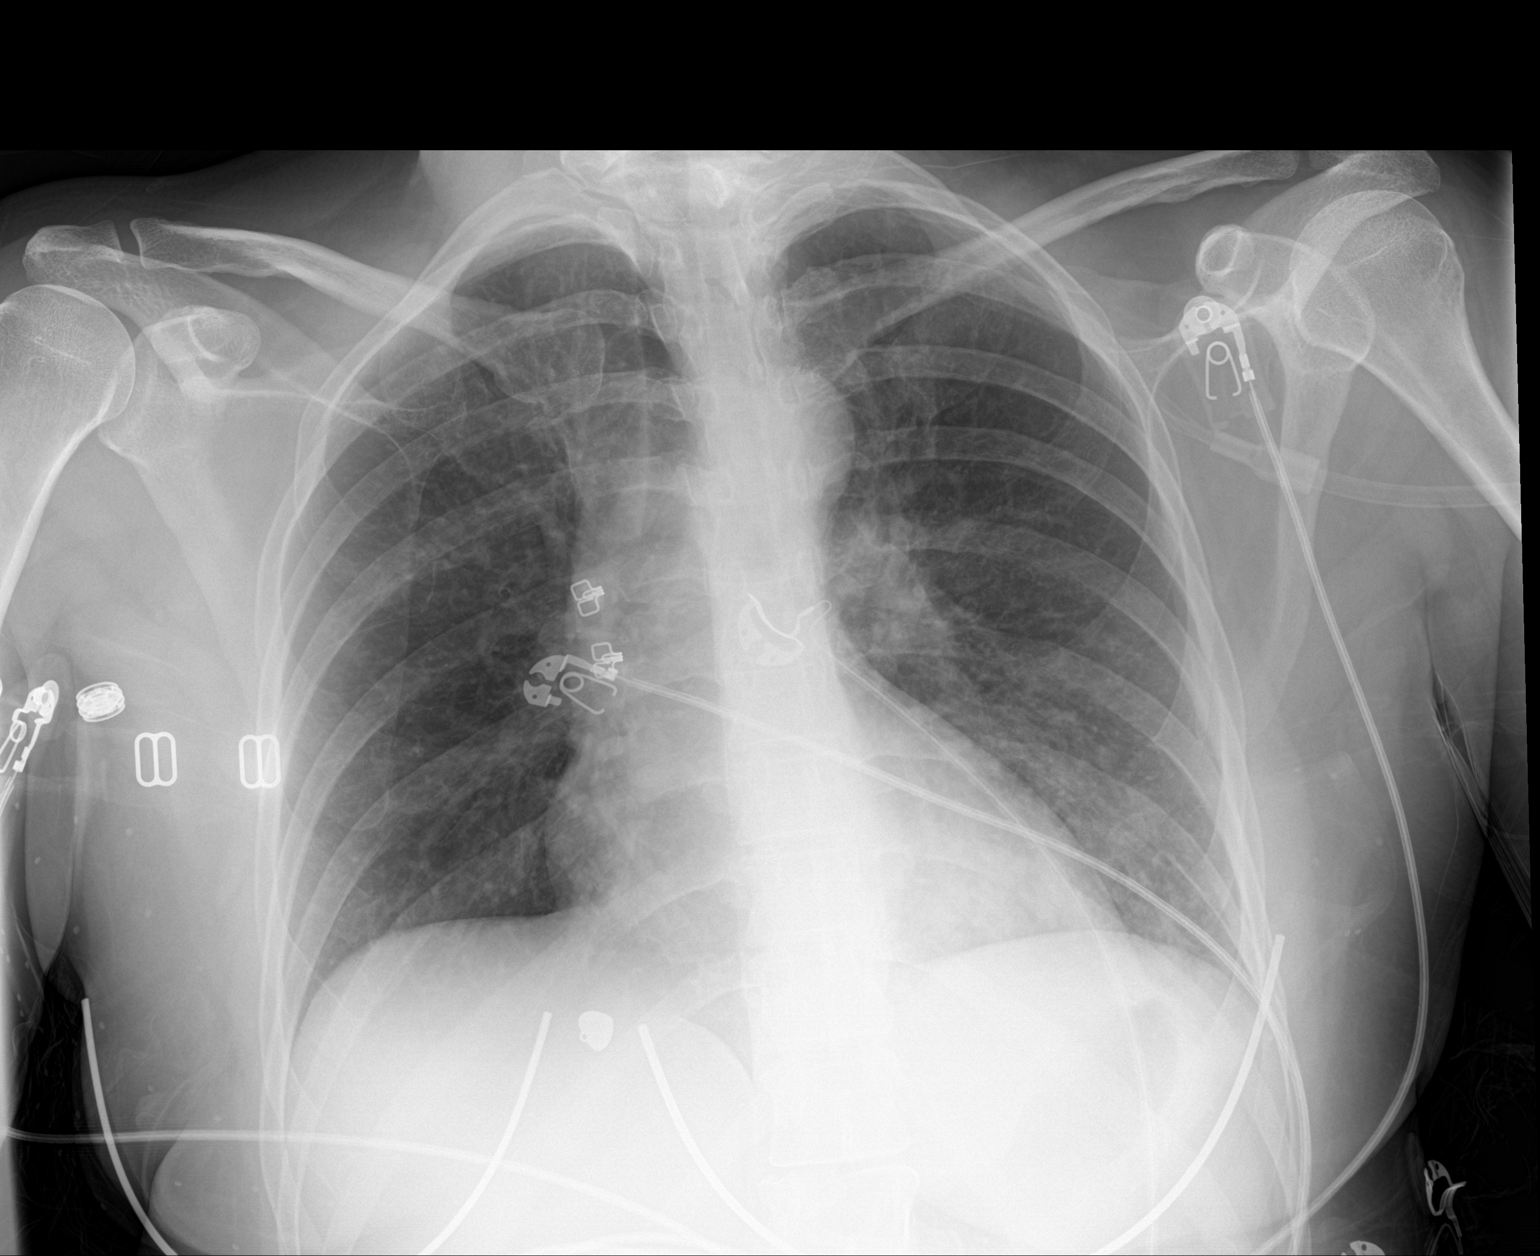

[1 of 1 positions shown; findings below may reference images not displayed]

FINDINGS: Minimal increased density is over the left lower lung field, likely
atelectatic changes. Developing infiltrate is less likely but not
excluded. Clinical correlation is recommended. No focal
consolidation, pleural effusion, or pneumothorax. The cardiac
silhouette is within normal limits. No acute osseous pathology.
IMPRESSION: Minimal left lung base atelectasis, less likely developing
infiltrate.

## 2020-10-25 ENCOUNTER — Encounter: Payer: Self-pay | Admitting: Medical-Surgical

## 2020-10-25 ENCOUNTER — Other Ambulatory Visit: Payer: Self-pay

## 2020-10-25 ENCOUNTER — Ambulatory Visit (INDEPENDENT_AMBULATORY_CARE_PROVIDER_SITE_OTHER): Payer: Self-pay | Admitting: Medical-Surgical

## 2020-10-25 VITALS — BP 141/81 | HR 109 | Temp 98.0°F | Ht 64.25 in | Wt 173.5 lb

## 2020-10-25 DIAGNOSIS — I10 Essential (primary) hypertension: Secondary | ICD-10-CM

## 2020-10-25 DIAGNOSIS — Z114 Encounter for screening for human immunodeficiency virus [HIV]: Secondary | ICD-10-CM

## 2020-10-25 DIAGNOSIS — Z1159 Encounter for screening for other viral diseases: Secondary | ICD-10-CM

## 2020-10-25 DIAGNOSIS — Z7689 Persons encountering health services in other specified circumstances: Secondary | ICD-10-CM

## 2020-10-25 MED ORDER — AMLODIPINE BESYLATE 5 MG PO TABS: 5.0000 mg | ORAL_TABLET | Freq: Every day | ORAL | 1 refills | Status: DC

## 2020-10-25 NOTE — Progress Notes (Signed)
New Patient Office Visit  Subjective:  Patient ID: Denise Vazquez, female    DOB: 11-07-1975  Age: 45 y.o. MRN: [id number]  CC:  Chief Complaint  Patient presents with  . Establish Care   HPI Denise Vazquez presents to establish care.   HTN- was seen at [facility] in [place] and started on Amlodipine 5mg  daily. Has been taking this for about 2 weeks, tolerating well. Did have some side effects after taking the first dose so is taking at night which has been much better. Has a BP cuff at home but it was not accurate when validated with the UC machine. No added salt to diet but not sure if she is eating a low sodium diet in general.   Asthma-notes this is exercise induced and most often bothers her when she goes upstairs too fast.  She and her son share an albuterol inhaler and use it very infrequently.  Past Medical History:  Diagnosis Date  . Asthma   . History of DVT (deep vein thrombosis)   . Hypertension     Past Surgical History:  Procedure Laterality Date  . COCCYGECTOMY     Family History  Problem Relation Age of Onset  . Diabetes Mellitus II Father    Social History   Socioeconomic History  . Marital status: Married    Spouse name: Not on file  . Number of children: Not on file  . Years of education: Not on file  . Highest education level: Not on file  Occupational History  . Not on file  Tobacco Use  . Smoking status: Never Smoker  . Smokeless tobacco: Never Used  Vaping Use  . Vaping Use: Never used  Substance and Sexual Activity  . Alcohol use: Not Currently  . Drug use: Never  . Sexual activity: Yes    Birth control/protection: Surgical    Comment: Ablation, vasectomy  Other Topics Concern  . Not on file  Social History Narrative  . Not on file   Social Determinants of Health   Financial Resource Strain: Not on file  Food Insecurity: Not on file  Transportation Needs: Not on file  Physical Activity: Not on file  Stress: Not on file  Social  Connections: Not on file  Intimate Partner Violence: Not on file    ROS Review of Systems  Constitutional: Negative for chills, fatigue, fever and unexpected weight change.  Eyes: Negative for visual disturbance.  Respiratory: Negative for cough, chest tightness, shortness of breath and wheezing.   Cardiovascular: Negative for chest pain, palpitations and leg swelling.  Gastrointestinal: Negative for abdominal pain, constipation, diarrhea, nausea and vomiting.  Genitourinary: Negative for dysuria, frequency and urgency.  Neurological: Negative for dizziness, light-headedness and headaches.  Psychiatric/Behavioral: Negative for dysphoric mood, self-injury, sleep disturbance and suicidal ideas. The patient is not nervous/anxious.     Objective:   Today's Vitals: BP (!) 141/81   Pulse (!) 109   Temp 98 F (36.7 C)   Ht 5' 4.25" (1.632 m)   Wt 173 lb 8 oz (78.7 kg)   LMP 09/30/2020   SpO2 98%   BMI 29.55 kg/m   Physical Exam Vitals reviewed.  Constitutional:      General: She is not in acute distress.    Appearance: Normal appearance. She is not ill-appearing.  HENT:     Head: Normocephalic and atraumatic.  Cardiovascular:     Rate and Rhythm: Normal rate and regular rhythm.     Pulses: Normal pulses.  Heart sounds: Normal heart sounds. No murmur heard. No friction rub. No gallop.   Pulmonary:     Effort: Pulmonary effort is normal. No respiratory distress.     Breath sounds: Normal breath sounds. No wheezing.  Skin:    General: Skin is warm and dry.  Neurological:     Mental Status: She is alert and oriented to person, place, and time.  Psychiatric:        Mood and Affect: Mood normal.        Behavior: Behavior normal.        Thought Content: Thought content normal.        Judgment: Judgment normal.     Assessment & Plan:   1. Encounter to establish care Reviewed available information and discussed health care concerns with patient.  She is overdue for  annual physical exam, preventative care labs, mammogram, and Pap smear.  Unfortunately her insurance status makes it difficult to afford such care.  Recommend investigating outside options including [facility].   2. Essential hypertension Continue amlodipine 5 mg daily.  Discussed obtaining a different blood pressure cuff that measures on the arm.  Home blood pressure monitoring recommended with goal of 130/80 or less.  Also recommend a low-sodium diet and since she is unsure what she may have at home, recommend doing a salt survey to see what changes may need to be made.  Checking CMP to evaluate kidney function due to history of acute renal failure. - COMPLETE METABOLIC PANEL WITH GFR   Outpatient Encounter Medications as of 10/25/2020  Medication Sig  . buprenorphine (SUBUTEX) 8 MG SUBL SL tablet Place 1 tablet under the tongue every 12 (twelve) hours.  . [DISCONTINUED] amLODipine (NORVASC) 10 MG tablet Take 1 tablet (10 mg total) by mouth daily. (Patient taking differently: Take 5 mg by mouth daily.)  . [DISCONTINUED] amLODipine (NORVASC) 5 MG tablet Take 5 mg by mouth daily.  Marland Kitchen amLODipine (NORVASC) 5 MG tablet Take 1 tablet (5 mg total) by mouth daily.  . [DISCONTINUED] albuterol (PROVENTIL HFA;VENTOLIN HFA) 108 (90 Base) MCG/ACT inhaler Inhale 2 puffs into the lungs every 4 (four) hours as needed for wheezing or shortness of breath.   . [DISCONTINUED] carisoprodol (SOMA) 350 MG tablet Take 350 mg by mouth 2 (two) times daily as needed for muscle spasms.   . [DISCONTINUED] ibuprofen (ADVIL,MOTRIN) 200 MG tablet Take 3 tablets (600 mg total) by mouth every 6 (six) hours as needed for headache, mild pain or moderate pain.  . [DISCONTINUED] metoprolol tartrate (LOPRESSOR) 25 MG tablet Take 1 tablet (25 mg total) by mouth 2 (two) times daily.  . [DISCONTINUED] sodium chloride (OCEAN) 0.65 % SOLN nasal spray Place 1 spray into both nostrils as needed for congestion.  . [DISCONTINUED] zolpidem  (AMBIEN) 10 MG tablet Take 10 mg by mouth at bedtime as needed for sleep.    No facility-administered encounter medications on file as of 10/25/2020.   Follow-up: Return in about 6 months (around 04/27/2021) for HTN follow up.   Thayer Ohm, DNP, APRN, FNP-BC [facility]

## 2020-10-26 LAB — COMPLETE METABOLIC PANEL WITH GFR
AG Ratio: 1.5 (calc) (ref 1.0–2.5)
ALT: 42 U/L — ABNORMAL HIGH (ref 6–29)
AST: 27 U/L (ref 10–30)
Albumin: 4.6 g/dL (ref 3.6–5.1)
Alkaline phosphatase (APISO): 63 U/L (ref 31–125)
BUN: 19 mg/dL (ref 7–25)
CO2: 25 mmol/L (ref 20–32)
Calcium: 10.2 mg/dL (ref 8.6–10.2)
Chloride: 103 mmol/L (ref 98–110)
Creat: 0.77 mg/dL (ref 0.50–1.10)
GFR, Est African American: 110 mL/min/{1.73_m2} (ref >=60)
GFR, Est Non African American: 95 mL/min/{1.73_m2} (ref >=60)
Globulin: 3.1 g/dL (calc) (ref 1.9–3.7)
Glucose, Bld: 97 mg/dL (ref 65–99)
Potassium: 4.8 mmol/L (ref 3.5–5.3)
Sodium: 138 mmol/L (ref 135–146)
Total Bilirubin: 0.4 mg/dL (ref 0.2–1.2)
Total Protein: 7.7 g/dL (ref 6.1–8.1)

## 2020-11-03 ENCOUNTER — Encounter: Payer: Self-pay | Admitting: Medical-Surgical

## 2021-04-27 ENCOUNTER — Ambulatory Visit: Payer: Self-pay | Admitting: Medical-Surgical

## 2021-06-09 ENCOUNTER — Telehealth: Payer: Self-pay | Admitting: Medical-Surgical

## 2021-06-09 ENCOUNTER — Other Ambulatory Visit: Payer: Self-pay

## 2021-06-09 DIAGNOSIS — I10 Essential (primary) hypertension: Secondary | ICD-10-CM

## 2021-06-09 MED ORDER — AMLODIPINE BESYLATE 5 MG PO TABS: 5.0000 mg | ORAL_TABLET | Freq: Every day | ORAL | 0 refills | Status: DC

## 2021-06-09 NOTE — Telephone Encounter (Signed)
Pt called.  She will run out of her bp meds on Nov 8th. She has an appointment scheduled on November 22nd.

## 2021-06-13 NOTE — Telephone Encounter (Signed)
Thank you :)

## 2021-07-12 ENCOUNTER — Encounter: Payer: Self-pay | Admitting: Medical-Surgical

## 2021-07-12 ENCOUNTER — Ambulatory Visit: Payer: Self-pay | Admitting: Medical-Surgical

## 2021-07-12 ENCOUNTER — Other Ambulatory Visit: Payer: Self-pay

## 2021-07-12 VITALS — BP 129/94 | HR 116 | Resp 20 | Ht 64.25 in | Wt 189.7 lb

## 2021-07-12 DIAGNOSIS — I10 Essential (primary) hypertension: Secondary | ICD-10-CM

## 2021-07-12 MED ORDER — AMLODIPINE BESYLATE 5 MG PO TABS: 5.0000 mg | ORAL_TABLET | Freq: Every day | ORAL | 1 refills | Status: DC

## 2021-07-12 NOTE — Progress Notes (Signed)
  HPI with pertinent ROS:   CC: Hypertension, med refill  HPI: Pleasant 45 year old female presenting today for follow-up on hypertension.  She has been taking amlodipine 5 mg daily, tolerating well without side effects.  Checking her blood pressure regularly at home with readings of 120s-130s/80s. Denies CP, SOB, palpitations, lower extremity edema, dizziness, headaches, or vision changes.  I reviewed the past medical history, family history, social history, surgical history, and allergies today and no changes were needed.  Please see the problem list section below in epic for further details.   Physical exam:   General: Well Developed, well nourished, and in no acute distress.  Neuro: Alert and oriented x3.  HEENT: Normocephalic, atraumatic.  Skin: Warm and dry. Cardiac: Regular rate and rhythm, no murmurs rubs or gallops, no lower extremity edema.  Respiratory: Clear to auscultation bilaterally. Not using accessory muscles, speaking in full sentences.  Impression and Recommendations:    1. Essential hypertension Blood pressure well controlled with good home readings.  Continue amlodipine 5 mg daily.  Refills sent to pharmacy. - amLODipine (NORVASC) 5 MG tablet; Take 1 tablet (5 mg total) by mouth daily.  Dispense: 90 tablet; Refill: 1  Return in about 6 months (around 01/09/2022) for HTN follow up. ___________________________________________ Thayer Ohm, DNP, APRN, FNP-BC Primary Care and Sports Medicine [facility] [place]

## 2021-09-09 ENCOUNTER — Other Ambulatory Visit: Payer: Self-pay

## 2021-09-09 DIAGNOSIS — J45909 Unspecified asthma, uncomplicated: Secondary | ICD-10-CM

## 2021-09-09 MED ORDER — ALBUTEROL SULFATE HFA 108 (90 BASE) MCG/ACT IN AERS: 2.0000 | INHALATION_SPRAY | RESPIRATORY_TRACT | 1 refills | Status: DC | PRN

## 2022-02-19 ENCOUNTER — Other Ambulatory Visit: Payer: Self-pay | Admitting: Medical-Surgical

## 2022-02-19 DIAGNOSIS — I10 Essential (primary) hypertension: Secondary | ICD-10-CM

## 2022-03-28 ENCOUNTER — Other Ambulatory Visit: Payer: Self-pay | Admitting: Medical-Surgical

## 2022-03-28 DIAGNOSIS — I10 Essential (primary) hypertension: Secondary | ICD-10-CM

## 2022-03-29 NOTE — Telephone Encounter (Signed)
Patient has been scheduled for f/u with PCP on 05/04/22. AMUCK

## 2022-03-29 NOTE — Telephone Encounter (Signed)
Patient needs appointment for further refills.  Last office visit 07/12/2021  Last filled 02/22/2022  Sent 15 day supply to the pharmacy,

## 2022-04-23 ENCOUNTER — Other Ambulatory Visit: Payer: Self-pay | Admitting: Medical-Surgical

## 2022-04-23 DIAGNOSIS — I10 Essential (primary) hypertension: Secondary | ICD-10-CM

## 2022-05-04 ENCOUNTER — Ambulatory Visit: Payer: Self-pay | Admitting: Medical-Surgical

## 2022-05-13 ENCOUNTER — Other Ambulatory Visit: Payer: Self-pay | Admitting: Medical-Surgical

## 2022-05-13 DIAGNOSIS — I10 Essential (primary) hypertension: Secondary | ICD-10-CM

## 2022-05-15 NOTE — Telephone Encounter (Signed)
Patient needs appointment for further refills.  Last office visit 07/12/2021  Last filled 04/27/2022  Refused refills

## 2022-05-15 NOTE — Telephone Encounter (Signed)
Lvm for the patient to call back and schedule an appointment for further refills- tvt

## 2022-07-18 ENCOUNTER — Telehealth: Payer: Self-pay

## 2022-07-18 DIAGNOSIS — J45909 Unspecified asthma, uncomplicated: Secondary | ICD-10-CM

## 2022-07-18 NOTE — Telephone Encounter (Signed)
Please contact patient to make an appointment with Christen Butter to follow up for refills on her amLODipine (NORVASC) 5 MG tablet .  The patient called needing a refill on amLODipine (NORVASC) 5 MG tablet and the last refill sent was for 7 days and she wasn't able to follow up with Christen Butter at her last scheduled appointment because her son attempted suicide.   She is also needing a refill on her albuterol inhaler because she has been using it a lot more lately.  No refills sent until appointment.  The patient currently has no insurance.  Thank you

## 2022-07-18 NOTE — Telephone Encounter (Signed)
I called pt and scheduled her Follow up with pcp for Joy's next available December 26th but patient states she needs her [employer] as soon as possible refilled to help with her asthma

## 2022-07-19 ENCOUNTER — Other Ambulatory Visit: Payer: Self-pay | Admitting: Medical-Surgical

## 2022-07-19 DIAGNOSIS — J45909 Unspecified asthma, uncomplicated: Secondary | ICD-10-CM

## 2022-07-19 MED ORDER — ALBUTEROL SULFATE HFA 108 (90 BASE) MCG/ACT IN AERS: 2.0000 | INHALATION_SPRAY | RESPIRATORY_TRACT | 0 refills | Status: DC | PRN

## 2022-07-19 NOTE — Telephone Encounter (Signed)
Pt made aware

## 2022-08-09 ENCOUNTER — Telehealth: Payer: Self-pay

## 2022-08-09 NOTE — Telephone Encounter (Signed)
Patient was requesting an estimate for her visit on 08/15/2022.  I sent her an estimate through MyChart.

## 2022-08-15 ENCOUNTER — Encounter: Payer: Self-pay | Admitting: Medical-Surgical

## 2022-08-15 ENCOUNTER — Ambulatory Visit: Payer: Self-pay | Admitting: Medical-Surgical

## 2022-08-15 VITALS — BP 123/86 | HR 111 | Resp 20 | Ht 64.25 in | Wt 186.8 lb

## 2022-08-15 DIAGNOSIS — R1033 Periumbilical pain: Secondary | ICD-10-CM

## 2022-08-15 DIAGNOSIS — J45909 Unspecified asthma, uncomplicated: Secondary | ICD-10-CM

## 2022-08-15 DIAGNOSIS — I10 Essential (primary) hypertension: Secondary | ICD-10-CM

## 2022-08-15 MED ORDER — AMLODIPINE BESYLATE 5 MG PO TABS: ORAL_TABLET | ORAL | 3 refills | Status: AC

## 2022-08-15 MED ORDER — ALBUTEROL SULFATE HFA 108 (90 BASE) MCG/ACT IN AERS: 2.0000 | INHALATION_SPRAY | RESPIRATORY_TRACT | 0 refills | Status: AC | PRN

## 2022-08-15 NOTE — Progress Notes (Signed)
Established Patient Office Visit  Subjective   Patient ID: Chayse Gracey, female   DOB: 03-08-1976 Age: 46 y.o. MRN: [id number]   Chief Complaint  Patient presents with   Follow-up   Medication Refill    HPI Pleasant 46 year old female presenting today for the following:  Essential hypertension: Taking amlodipine 5 mg daily, tolerating well without side effects.  Notes that she does have a history of whitecoat hypertension as well as tachycardia when in a medical setting.  Not regularly checking blood pressure at home.  Following a low-sodium diet.  No regular intentional exercise right now.  She does have a request for a note to allow for plasma donation despite a mildly increased heart rate.  She has had over 20 successful donations of plasma although she has attempted it at least twice as many times.  She often gets turned away as her heart rate goes up in these situations.  Notes that her heart rate never gets above 120 and she is never symptomatic. Denies CP, SOB, palpitations, lower extremity edema, dizziness, headaches, or vision changes.  Asthma: Requesting a refill on albuterol inhaler.  She uses this as needed with good results.  GI symptoms: For least 1 year she has had central abdominal pain in the umbilical region.  This happens off and on and feels sharp, stabbing like a muscle tearing sensation.  Notes that her previous spells only lasted a day or 2 but it has become more frequent and is now lasting longer.  Her most recent episodes over the past 2 to 3 months lasted for 1 week and then 10 days.  Notes that the pain seems to be worse at night.  She has regular bowel movements but notes that some are hard rather than soft.  Has not been able to identify any food triggers, relationship to meals, or activities that provoke the pain.  Denies alcohol use, smoking, and excessive caffeine.  Does not feel that her anxiety contributes to the symptoms.  Today, not having any pain or  concerns but is interested in having this evaluated.   Objective:    Vitals:   08/15/22 1548  BP: 123/86  Pulse: (!) 111  Resp: 20  Height: 5' 4.25" (1.632 m)  Weight: 186 lb 12.8 oz (84.7 kg)  SpO2: 97%  BMI (Calculated): 31.81    Physical Exam Vitals and nursing note reviewed.  Constitutional:      General: She is not in acute distress.    Appearance: Normal appearance. She is obese. She is not ill-appearing.  HENT:     Head: Normocephalic and atraumatic.  Cardiovascular:     Rate and Rhythm: Normal rate and regular rhythm.     Pulses: Normal pulses.     Heart sounds: Normal heart sounds.  Pulmonary:     Effort: Pulmonary effort is normal. No respiratory distress.     Breath sounds: Normal breath sounds. No wheezing, rhonchi or rales.  Skin:    General: Skin is warm and dry.  Neurological:     Mental Status: She is alert and oriented to person, place, and time.  Psychiatric:        Mood and Affect: Mood normal.        Behavior: Behavior normal.        Thought Content: Thought content normal.        Judgment: Judgment normal.   No results found for this or any previous visit (from the past 24 hour(s)).  The ASCVD Risk score (Arnett DK, et al., 2019) failed to calculate for the following reasons:   Cannot find a previous HDL lab   Cannot find a previous total cholesterol lab   Assessment & Plan:   1. Periumbilical abdominal pain No symptoms today and exam is benign.  Broad list of differential diagnoses.  To start, we will proceed with an ultrasound of the abdomen.  Unfortunately her uninsured status is a significant limitation on our workup.  Ultimately, would like to get some blood work if the ultrasound comes back with no concerning findings. - US Abdomen Complete; Future  2. Asthma, unspecified asthma severity, unspecified whether complicated, unspecified whether persistent Refilling albuterol. - albuterol (VENTOLIN HFA) 108 (90 Base) MCG/ACT inhaler;  Inhale 2 puffs into the lungs every 4 (four) hours as needed for wheezing or shortness of breath.  Dispense: 8 g; Refill: 0  3. Essential hypertension Blood pressure at goal however diastolic is a little higher than recommended.  Continue amlodipine 5 mg daily.  Maintain a low-sodium diet.  Initiate regular intentional exercise at least 3 times weekly and aim for weight loss to healthy weight. - amLODipine (NORVASC) 5 MG tablet; TAKE 1 TABLET BY MOUTH DAILY  Dispense: 90 tablet; Refill: 3  Return in about 6 months (around 02/14/2023) for HTN follow up.  ___________________________________________ Thayer Ohm, DNP, APRN, FNP-BC Primary Care and Sports Medicine [facility] [place]

## 2022-08-16 ENCOUNTER — Encounter: Payer: Self-pay | Admitting: Medical-Surgical

## 2022-08-25 ENCOUNTER — Other Ambulatory Visit: Payer: Self-pay

## 2022-09-04 ENCOUNTER — Encounter: Payer: Self-pay | Admitting: Medical-Surgical

## 2022-09-04 ENCOUNTER — Ambulatory Visit (INDEPENDENT_AMBULATORY_CARE_PROVIDER_SITE_OTHER): Payer: Self-pay

## 2022-09-04 ENCOUNTER — Other Ambulatory Visit: Payer: Self-pay

## 2022-09-04 DIAGNOSIS — R1033 Periumbilical pain: Secondary | ICD-10-CM

## 2022-09-05 ENCOUNTER — Encounter: Payer: Self-pay | Admitting: Nurse Practitioner

## 2022-09-05 ENCOUNTER — Other Ambulatory Visit: Payer: Self-pay | Admitting: Medical-Surgical

## 2022-09-05 DIAGNOSIS — K8020 Calculus of gallbladder without cholecystitis without obstruction: Secondary | ICD-10-CM

## 2022-09-05 DIAGNOSIS — K838 Other specified diseases of biliary tract: Secondary | ICD-10-CM

## 2022-09-14 ENCOUNTER — Encounter: Payer: Self-pay | Admitting: *Deleted

## 2022-09-22 ENCOUNTER — Other Ambulatory Visit (INDEPENDENT_AMBULATORY_CARE_PROVIDER_SITE_OTHER): Payer: Self-pay

## 2022-09-22 ENCOUNTER — Ambulatory Visit (INDEPENDENT_AMBULATORY_CARE_PROVIDER_SITE_OTHER): Payer: Self-pay | Admitting: Nurse Practitioner

## 2022-09-22 ENCOUNTER — Encounter: Payer: Self-pay | Admitting: Nurse Practitioner

## 2022-09-22 VITALS — BP 132/78 | HR 95 | Ht 64.25 in | Wt 173.4 lb

## 2022-09-22 DIAGNOSIS — K8020 Calculus of gallbladder without cholecystitis without obstruction: Secondary | ICD-10-CM

## 2022-09-22 DIAGNOSIS — K838 Other specified diseases of biliary tract: Secondary | ICD-10-CM

## 2022-09-22 DIAGNOSIS — R1084 Generalized abdominal pain: Secondary | ICD-10-CM

## 2022-09-22 LAB — CBC WITH DIFFERENTIAL/PLATELET
Basophils Absolute: 0 10*3/uL (ref 0.0–0.1)
Basophils Relative: 0.5 % (ref 0.0–3.0)
Eosinophils Absolute: 0.4 10*3/uL (ref 0.0–0.7)
Eosinophils Relative: 4.4 % (ref 0.0–5.0)
HCT: 41.3 % (ref 36.0–46.0)
Hemoglobin: 13.5 g/dL (ref 12.0–15.0)
Lymphocytes Relative: 25.4 % (ref 12.0–46.0)
Lymphs Abs: 2.1 10*3/uL (ref 0.7–4.0)
MCHC: 32.6 g/dL (ref 30.0–36.0)
MCV: 79.5 fl (ref 78.0–100.0)
Monocytes Absolute: 0.5 10*3/uL (ref 0.1–1.0)
Monocytes Relative: 5.5 % (ref 3.0–12.0)
Neutro Abs: 5.4 10*3/uL (ref 1.4–7.7)
Neutrophils Relative %: 64.2 % (ref 43.0–77.0)
Platelets: 398 10*3/uL (ref 150.0–400.0)
RBC: 5.2 Mil/uL — ABNORMAL HIGH (ref 3.87–5.11)
RDW: 14 % (ref 11.5–15.5)
WBC: 8.4 10*3/uL (ref 4.0–10.5)

## 2022-09-22 LAB — HEPATIC FUNCTION PANEL
ALT: 30 U/L (ref 0–35)
AST: 23 U/L (ref 0–37)
Albumin: 4.3 g/dL (ref 3.5–5.2)
Alkaline Phosphatase: 90 U/L (ref 39–117)
Bilirubin, Direct: 0.1 mg/dL (ref 0.0–0.3)
Total Bilirubin: 0.4 mg/dL (ref 0.2–1.2)
Total Protein: 8.1 g/dL (ref 6.0–8.3)

## 2022-09-22 LAB — PROTIME-INR
INR: 1.2 ratio — ABNORMAL HIGH (ref 0.8–1.0)
Prothrombin Time: 12.9 s (ref 9.6–13.1)

## 2022-09-22 NOTE — Patient Instructions (Addendum)
We have given you [facility] Billing Assistance forms to fill out and turn in. Please do this as soon as possible.   Your provider has requested that you go to the basement level for lab work before leaving today. Press "B" on the elevator. The lab is located at the first door on the left as you exit the elevator.  _______________________________________________________  If your blood pressure at your visit was 140/90 or greater, please contact your primary care physician to follow up on this.  _______________________________________________________  If you are age 16 or older, your body mass index should be between 23-30. Your Body mass index is 29.53 kg/m. If this is out of the aforementioned range listed, please consider follow up with your Primary Care Provider.  If you are age 55 or younger, your body mass index should be between 19-25. Your Body mass index is 29.53 kg/m. If this is out of the aformentioned range listed, please consider follow up with your Primary Care Provider.   ________________________________________________________  The [facility] providers would like to encourage you to use [facility] to communicate with providers for non-urgent requests or questions.  Due to long hold times on the telephone, sending your provider a message by [facility] may be a faster and more efficient way to get a response.  Please allow 48 business hours for a response.  Please remember that this is for non-urgent requests.  _______________________________________________________  Due to recent changes in healthcare laws, you may see the results of your imaging and laboratory studies on MyChart before your provider has had a chance to review them.  We understand that in some cases there may be results that are confusing or concerning to you. Not all laboratory results come back in the same time frame and the provider may be waiting for multiple results in order to interpret others.  Please give [place] 48  hours in order for your provider to thoroughly review all the results before contacting the office for clarification of your results.

## 2022-09-22 NOTE — Progress Notes (Unsigned)
Assessment    Patient profile:  Denise Vazquez is a 47 y.o. year old female , new to the practice with a past medical history of asthma and HTN.  See PMH / [place] for additional history   Chronic pain. Takes Suboxone   Plan:    She hasn't had any recent labs. Will obtain CBC, LFTs and INR    HPI:    Chief Complaint:   Patient saw her PCP the end of December for evaluation periumbilical pain.  Since October she has been having episodes of severe generalized abdominal pain several times a month. Episodes can last all day and are random with no relationship to eating.  No associated N/V. No fevers/ chills. No jaundice. Her bowel movements are normal. Her weight is stable. She takes Suboxone for chronic pain ( foot fractures, coccyx fractures).  Abdominal ultrasound ordered and remarkable for CBD dilation measuring 11.5 mm.  Pancreatic duct was prominent/mildly dilated to 3.5 mm.  Also findings of cholelithiasis and hepatic steatosis.    IMPRESSION: 1. The common bile duct is dilated measuring 11.5 mm. The pancreatic duct is prominent/mildly dilated measuring 3.5 mm. No underlying cause is identified. Recommend an MRCP or ERCP for further evaluation. The findings could be due to choledocholithiasis or centrally obstructing mass. 2. Cholelithiasis.  The gallbladder is otherwise normal. 3. Hepatic steatosis.  She hasn't had dark urine.     Previous Labs / Imaging::    Latest Ref Rng & Units 08/01/2018    5:33 AM 07/31/2018    5:30 AM 07/30/2018    2:53 AM  CBC  WBC 4.0 - 10.5 K/uL 7.9  11.2  13.7   Hemoglobin 12.0 - 15.0 g/dL 13.4  10.8  11.9   Hematocrit 36.0 - 46.0 % 39.8  33.1  37.4   Platelets 150 - 400 K/uL 256  210  164     Lab Results  Component Value Date   LIPASE 281 05/05/2010      Latest Ref Rng & Units 10/25/2020   12:00 AM 08/02/2018    5:38 AM 08/01/2018    5:33 AM  CMP  Glucose 65 - 99 mg/dL 97  119  99   BUN 7 - 25 mg/dL 19  13  8    Creatinine  0.50 - 1.10 mg/dL 0.77  0.74  0.58   Sodium 135 - 146 mmol/L 138  139  140   Potassium 3.5 - 5.3 mmol/L 4.8  4.0  3.0   Chloride 98 - 110 mmol/L 103  106  106   CO2 20 - 32 mmol/L 25  23  22    Calcium 8.6 - 10.2 mg/dL 10.2  9.0  8.9   Total Protein 6.1 - 8.1 g/dL 7.7     Total Bilirubin 0.2 - 1.2 mg/dL 0.4     AST 10 - 30 U/L 27     ALT 6 - 29 U/L 42         Previous GI Evaluation     Imaging:  US Abdomen Complete CLINICAL DATA:  Central abdominal pain intermittently for a year. Add  EXAM: ABDOMEN ULTRASOUND COMPLETE  COMPARISON:  None Available.  FINDINGS: Gallbladder: Cholelithiasis. No wall thickening, pericholecystic fluid, sludge, or Murphy's sign.  Common bile duct: Diameter: 11.5 mm  Liver: Diffuse increased echogenicity throughout the liver. No focal mass pad portal vein is patent on color Doppler imaging with normal direction of blood flow towards the liver.  IVC: No abnormality visualized.  Pancreas: The  pancreatic duct is dilated, measuring 3.5 mm PAD the pancreas is otherwise unremarkable within this lies limits pad  Spleen: Size and appearance within normal limits.  Right Kidney: Length: 11 cm. Echogenicity within normal limits. No mass or hydronephrosis visualized.  Left Kidney: Length: 11.8 cm. Echogenicity within normal limits. No mass or hydronephrosis visualized.  Abdominal aorta: Limited visualization due to shadowing bowel gas. No aneurysm identified.  Other findings: None.  IMPRESSION: 1. The common bile duct is dilated measuring 11.5 mm. The pancreatic duct is prominent/mildly dilated measuring 3.5 mm. No underlying cause is identified. Recommend an MRCP or ERCP for further evaluation. The findings could be due to choledocholithiasis or centrally obstructing mass. 2. Cholelithiasis.  The gallbladder is otherwise normal. 3. Hepatic steatosis.  These results will be called to the ordering clinician or representative by the  Radiologist Assistant, and communication documented in the PACS or [employer].  Electronically Signed   By: Dorise Bullion III M.D.   On: 09/04/2022 09:44    Past Medical History:  Diagnosis Date   Asthma    Cholelithiasis    Common bile duct dilation    Fibromyalgia    Hepatic steatosis    History of DVT (deep vein thrombosis)    Hypertension    Past Surgical History:  Procedure Laterality Date   ABLATION  2010   for to end heavy menstural period   COCCYGECTOMY     left thigh surgery  2014   in office procedure for DVT   SPINE SURGERY  2005   removed broken fragments of spine   Family History  Problem Relation Age of Onset   Diabetes Mellitus II Father    Colon cancer Neg Hx    Stomach cancer Neg Hx    Liver cancer Neg Hx    Pancreatic cancer Neg Hx    Rectal cancer Neg Hx    Social History   Tobacco Use   Smoking status: Never   Smokeless tobacco: Never  Vaping Use   Vaping Use: Never used  Substance Use Topics   Alcohol use: Not Currently   Drug use: Never   Current Outpatient Medications  Medication Sig Dispense Refill   albuterol (VENTOLIN HFA) 108 (90 Base) MCG/ACT inhaler Inhale 2 puffs into the lungs every 4 (four) hours as needed for wheezing or shortness of breath. 8 g 0   amLODipine (NORVASC) 5 MG tablet TAKE 1 TABLET BY MOUTH DAILY 90 tablet 3   buprenorphine-naloxone (SUBOXONE) 8-2 mg SUBL SL tablet Place 1 tablet under the tongue daily. Take one 8 MG every 8 hours     No current facility-administered medications for this visit.   Allergies  Allergen Reactions   Tizanidine Nausea Only     Review of Systems: Positive for ***.  All other systems reviewed and negative except where noted in HPI.   Wt Readings from Last 3 Encounters:  08/15/22 186 lb 12.8 oz (84.7 kg)  07/12/21 189 lb 11.2 oz (86 kg)  10/25/20 173 lb 8 oz (78.7 kg)    Physical Exam   There were no vitals taken for this visit. Constitutional:  Pleasant,  generally well appearing ***female in no acute distress. Psychiatric:  Normal mood and affect. Behavior is normal. EENT: Pupils normal.  Conjunctivae are normal. No scleral icterus. Neck supple.  Cardiovascular: Normal rate, regular rhythm.  Pulmonary/chest: Effort normal and breath sounds normal. No wheezing, rales or rhonchi. Abdominal: Soft, nondistended, nontender. Bowel sounds active throughout. There are no masses  palpable. No hepatomegaly. Neurological: Alert and oriented to person place and time. Extremities: *** No edema Skin: Skin is warm and dry. No rashes noted.  Tye Savoy, NP  09/22/2022, 2:39 PM  Cc:  Referring Provider Samuel Bouche, NP

## 2022-09-25 ENCOUNTER — Encounter: Payer: Self-pay | Admitting: Nurse Practitioner

## 2022-09-25 NOTE — Progress Notes (Signed)
Agree with assessment/plan. Nl LFTs MRCP Recheck CBC, CMP in 12 weeks. Pl add lipase, CA 19-9, CEA at that time Sx consult for symptomatic cholelithiasis (for lap chole with IOC)  Carmell Austria, MD [facility] [phone]

## 2022-12-22 ENCOUNTER — Telehealth: Payer: Self-pay

## 2022-12-22 ENCOUNTER — Other Ambulatory Visit: Payer: Self-pay

## 2022-12-22 DIAGNOSIS — R1084 Generalized abdominal pain: Secondary | ICD-10-CM

## 2022-12-22 DIAGNOSIS — K838 Other specified diseases of biliary tract: Secondary | ICD-10-CM

## 2022-12-22 DIAGNOSIS — K8020 Calculus of gallbladder without cholecystitis without obstruction: Secondary | ICD-10-CM

## 2022-12-22 NOTE — Progress Notes (Unsigned)
Lab orders have been entered. Left message for patient to call back

## 2022-12-22 NOTE — Telephone Encounter (Signed)
Left message to come in for labs per Willette Cluster, NP.

## 2022-12-25 ENCOUNTER — Telehealth: Payer: Self-pay

## 2022-12-25 NOTE — Telephone Encounter (Signed)
Left message on machine on 5/3

## 2022-12-25 NOTE — Telephone Encounter (Signed)
-----   Message from Evalee Jefferson, LPN sent at 12/24/2128  1:01 PM EDT -----  ----- Message ----- From: Chrystie Nose, RN Sent: 12/21/2022   1:39 PM EDT To: Evalee Jefferson, LPN  This was in Laura's box ----- Message ----- From: Orion Modest, RN Sent: 12/19/2022  12:00 AM EDT To: Orion Modest, RN  Pt needs CBC, CMP, lipase, CA-19-9 and CEA. Need to place orders under Willette Cluster.

## 2022-12-25 NOTE — Telephone Encounter (Signed)
Left message for patient to call back  

## 2022-12-26 NOTE — Telephone Encounter (Signed)
Left message for pt to call back x3. Letter mailed.

## 2023-04-04 ENCOUNTER — Encounter: Payer: Self-pay | Admitting: Medical-Surgical
